# Patient Record
Sex: Female | Born: 1955 | Race: White | Hispanic: No | Marital: Married | Smoking: Never smoker
Health system: Southern US, Community
[De-identification: ages and names within clinical notes are randomized; demographics above are authoritative.]

## PROBLEM LIST (undated history)

## (undated) DIAGNOSIS — G4730 Sleep apnea, unspecified: Secondary | ICD-10-CM

## (undated) DIAGNOSIS — C4490 Unspecified malignant neoplasm of skin, unspecified: Secondary | ICD-10-CM

## (undated) DIAGNOSIS — C801 Malignant (primary) neoplasm, unspecified: Secondary | ICD-10-CM

## (undated) DIAGNOSIS — K219 Gastro-esophageal reflux disease without esophagitis: Secondary | ICD-10-CM

## (undated) DIAGNOSIS — I10 Essential (primary) hypertension: Secondary | ICD-10-CM

## (undated) DIAGNOSIS — E785 Hyperlipidemia, unspecified: Secondary | ICD-10-CM

## (undated) HISTORY — DX: Unspecified malignant neoplasm of skin, unspecified: C44.90

## (undated) HISTORY — PX: CHOLECYSTECTOMY: SHX55

---

## 2004-08-27 ENCOUNTER — Ambulatory Visit: Payer: Self-pay | Admitting: Obstetrics and Gynecology

## 2004-09-02 ENCOUNTER — Ambulatory Visit: Payer: Self-pay | Admitting: Obstetrics and Gynecology

## 2005-01-01 ENCOUNTER — Ambulatory Visit: Payer: Self-pay | Admitting: Obstetrics and Gynecology

## 2005-08-03 ENCOUNTER — Ambulatory Visit: Payer: Self-pay | Admitting: Family Medicine

## 2005-09-10 ENCOUNTER — Ambulatory Visit: Payer: Self-pay | Admitting: Family Medicine

## 2005-12-24 ENCOUNTER — Ambulatory Visit: Payer: Self-pay

## 2007-02-09 ENCOUNTER — Ambulatory Visit: Payer: Self-pay | Admitting: Family Medicine

## 2007-03-29 ENCOUNTER — Ambulatory Visit: Payer: Self-pay

## 2007-04-28 ENCOUNTER — Ambulatory Visit: Payer: Self-pay

## 2007-05-08 ENCOUNTER — Ambulatory Visit: Payer: Self-pay | Admitting: Family Medicine

## 2007-08-06 ENCOUNTER — Other Ambulatory Visit: Payer: Self-pay

## 2007-08-06 ENCOUNTER — Emergency Department: Payer: Self-pay | Admitting: Emergency Medicine

## 2008-04-03 ENCOUNTER — Ambulatory Visit: Payer: Self-pay

## 2008-06-17 ENCOUNTER — Ambulatory Visit: Payer: Self-pay | Admitting: Family Medicine

## 2008-09-28 DIAGNOSIS — L570 Actinic keratosis: Secondary | ICD-10-CM

## 2008-09-28 HISTORY — DX: Actinic keratosis: L57.0

## 2009-04-23 ENCOUNTER — Ambulatory Visit: Payer: Self-pay

## 2010-06-10 ENCOUNTER — Ambulatory Visit: Payer: Self-pay

## 2010-06-22 ENCOUNTER — Ambulatory Visit: Payer: Self-pay | Admitting: Family Medicine

## 2010-09-08 ENCOUNTER — Ambulatory Visit: Payer: Self-pay | Admitting: Gastroenterology

## 2011-07-21 ENCOUNTER — Ambulatory Visit: Payer: Self-pay

## 2012-12-20 ENCOUNTER — Ambulatory Visit: Payer: Self-pay | Admitting: Nurse Practitioner

## 2013-07-04 ENCOUNTER — Ambulatory Visit: Payer: Self-pay | Admitting: Physician Assistant

## 2013-07-04 LAB — CBC WITH DIFFERENTIAL/PLATELET
Basophil #: 0 10*3/uL (ref 0.0–0.1)
Basophil %: 0.7 %
EOS ABS: 0.1 10*3/uL (ref 0.0–0.7)
EOS PCT: 1.1 %
HCT: 43.5 % (ref 35.0–47.0)
HGB: 14.9 g/dL (ref 12.0–16.0)
LYMPHS PCT: 11.9 %
Lymphocyte #: 0.7 10*3/uL — ABNORMAL LOW (ref 1.0–3.6)
MCH: 31.9 pg (ref 26.0–34.0)
MCHC: 34.2 g/dL (ref 32.0–36.0)
MCV: 93 fL (ref 80–100)
Monocyte #: 0.6 x10 3/mm (ref 0.2–0.9)
Monocyte %: 10.1 %
Neutrophil #: 4.7 10*3/uL (ref 1.4–6.5)
Neutrophil %: 76.2 %
PLATELETS: 174 10*3/uL (ref 150–440)
RBC: 4.66 10*6/uL (ref 3.80–5.20)
RDW: 13.4 % (ref 11.5–14.5)
WBC: 6.2 10*3/uL (ref 3.6–11.0)

## 2013-07-04 LAB — BASIC METABOLIC PANEL
Anion Gap: 8 (ref 7–16)
BUN: 15 mg/dL (ref 7–18)
CALCIUM: 9.4 mg/dL (ref 8.5–10.1)
CO2: 28 mmol/L (ref 21–32)
CREATININE: 0.89 mg/dL (ref 0.60–1.30)
Chloride: 103 mmol/L (ref 98–107)
GLUCOSE: 80 mg/dL (ref 65–99)
Osmolality: 277 (ref 275–301)
POTASSIUM: 3.9 mmol/L (ref 3.5–5.1)
SODIUM: 139 mmol/L (ref 136–145)

## 2013-10-19 ENCOUNTER — Ambulatory Visit: Payer: Self-pay | Admitting: Unknown Physician Specialty

## 2014-03-14 ENCOUNTER — Ambulatory Visit: Payer: Self-pay | Admitting: Nurse Practitioner

## 2018-05-16 ENCOUNTER — Other Ambulatory Visit: Payer: Self-pay

## 2018-05-16 ENCOUNTER — Ambulatory Visit
Admission: EM | Admit: 2018-05-16 | Discharge: 2018-05-16 | Disposition: A | Discharge status: Home / Self Care | Payer: Managed Care, Other (non HMO) | Attending: Family Medicine | Admitting: Family Medicine

## 2018-05-16 DIAGNOSIS — Z79890 Hormone replacement therapy (postmenopausal): Secondary | ICD-10-CM | POA: Insufficient documentation

## 2018-05-16 DIAGNOSIS — R059 Cough, unspecified: Secondary | ICD-10-CM

## 2018-05-16 DIAGNOSIS — Z885 Allergy status to narcotic agent status: Secondary | ICD-10-CM | POA: Insufficient documentation

## 2018-05-16 DIAGNOSIS — J209 Acute bronchitis, unspecified: Secondary | ICD-10-CM | POA: Diagnosis not present

## 2018-05-16 DIAGNOSIS — K219 Gastro-esophageal reflux disease without esophagitis: Secondary | ICD-10-CM | POA: Diagnosis not present

## 2018-05-16 DIAGNOSIS — I10 Essential (primary) hypertension: Secondary | ICD-10-CM | POA: Diagnosis not present

## 2018-05-16 DIAGNOSIS — Z9049 Acquired absence of other specified parts of digestive tract: Secondary | ICD-10-CM | POA: Diagnosis not present

## 2018-05-16 DIAGNOSIS — J441 Chronic obstructive pulmonary disease with (acute) exacerbation: Secondary | ICD-10-CM | POA: Diagnosis not present

## 2018-05-16 DIAGNOSIS — R05 Cough: Secondary | ICD-10-CM | POA: Insufficient documentation

## 2018-05-16 DIAGNOSIS — E785 Hyperlipidemia, unspecified: Secondary | ICD-10-CM | POA: Insufficient documentation

## 2018-05-16 DIAGNOSIS — Z79899 Other long term (current) drug therapy: Secondary | ICD-10-CM | POA: Insufficient documentation

## 2018-05-16 DIAGNOSIS — J440 Chronic obstructive pulmonary disease with acute lower respiratory infection: Secondary | ICD-10-CM | POA: Diagnosis not present

## 2018-05-16 DIAGNOSIS — Z881 Allergy status to other antibiotic agents status: Secondary | ICD-10-CM | POA: Diagnosis not present

## 2018-05-16 HISTORY — DX: Gastro-esophageal reflux disease without esophagitis: K21.9

## 2018-05-16 HISTORY — DX: Essential (primary) hypertension: I10

## 2018-05-16 HISTORY — DX: Hyperlipidemia, unspecified: E78.5

## 2018-05-16 LAB — RAPID INFLUENZA A&B ANTIGENS
Influenza A (ARMC): NEGATIVE
Influenza B (ARMC): NEGATIVE

## 2018-05-16 MED ORDER — PREDNISONE 20 MG PO TABS: 40.0000 mg | ORAL_TABLET | Freq: Every day | ORAL | 0 refills | Status: AC

## 2018-05-16 MED ORDER — DOXYCYCLINE HYCLATE 100 MG PO CAPS: 100.0000 mg | ORAL_CAPSULE | Freq: Two times a day (BID) | ORAL | 0 refills | Status: AC

## 2018-05-16 NOTE — Discharge Instructions (Signed)
BRONCHITIS: Take Mucinex D throughout the day and drink plenty of fluids to break up the mucus . Take cough syrup at bedtime if needed, for cough and to assist with sleep. Take Ibuprofen or other NSAID for relief of any pleuritic pain. Increase rest and fluid intake. Return to the clinic, your PCP, or ER if you have any new/ worsening symptoms such as fever, chest pain, difficulty breathing, worsening cough, mental status changes, lethargy, etc.

## 2018-05-16 NOTE — ED Triage Notes (Signed)
Patient reports that she has been coughing x 3 weeks. Patient states that she did have some heaviness in her chest today with shortness of breath and headache, states that she had one episode of diarrhea.

## 2018-05-16 NOTE — ED Provider Notes (Signed)
[facility]    CSN: [id number] Arrival date & time: 05/16/18  1735     History   Chief Complaint Chief Complaint  Patient presents with  . Cough    HPI Gloria Simpson is a 62 y.o. female. Patient states she has been having cough, fatigue, headaches, and shortness of breath since earlier today. States she has a cold a couple weeks ago but seemed to get better until today. Denies fever. Patient states she has had diffuse chest pressure and "fullness" today. Denies pain. She does have COPD and has been using inhalers as prescribed. Patient has no other concerns today.  HPI  Past Medical History:  Diagnosis Date  . GERD (gastroesophageal reflux disease)   . Hyperlipidemia   . Hypertension     There are no active problems to display for this patient.   Past Surgical History:  Procedure Laterality Date  . CESAREAN SECTION    . CHOLECYSTECTOMY      OB History   No obstetric history on file.      Home Medications    Prior to Admission medications   Medication Sig Start Date End Date Taking? Authorizing Provider  albuterol (PROVENTIL HFA;VENTOLIN HFA) 108 (90 Base) MCG/ACT inhaler Inhale into the lungs. 04/21/18  Yes [provider]  atorvastatin (LIPITOR) 10 MG tablet TAKE 1 TABLET EVERY OTHER DAY 03/28/18  Yes [provider]  fluticasone (FLONASE) 50 MCG/ACT nasal spray Place into the nose. 03/25/16  Yes [provider]  levothyroxine (SYNTHROID, LEVOTHROID) 50 MCG tablet TAKE 1 TABLET NIGHTLY 03/28/18  Yes [provider]  pantoprazole (PROTONIX) 40 MG tablet TAKE 1 TABLET DAILY 05/03/18  Yes [provider]  valsartan-hydrochlorothiazide (DIOVAN-HCT) 80-12.5 MG tablet TAKE 1 TABLET DAILY 03/28/18  Yes [provider]  doxycycline (VIBRAMYCIN) 100 MG capsule Take 1 capsule (100 mg total) by mouth 2 (two) times daily for 7 days. 05/16/18 05/23/18  Danton Clap, PA-C  predniSONE (DELTASONE) 20 MG  tablet Take 2 tablets (40 mg total) by mouth daily for 5 days. 05/16/18 05/21/18  Danton Clap, PA-C    Family History Family History  Problem Relation Age of Onset  . Dementia Mother   . Kidney cancer Father     Social History Social History   Tobacco Use  . Smoking status: Never Smoker  . Smokeless tobacco: Never Used  Substance Use Topics  . Alcohol use: Never    Frequency: Never  . Drug use: Never     Allergies   Oxycodone-acetaminophen; Clarithromycin; and Codeine   Review of Systems Review of Systems  Constitutional: Positive for chills and fatigue. Negative for diaphoresis and fever.  HENT: Positive for congestion, ear pain, rhinorrhea and sore throat.   Eyes: Negative for discharge and redness.  Respiratory: Positive for cough and shortness of breath. Negative for chest tightness and wheezing.   Cardiovascular: Negative for chest pain and palpitations.  Gastrointestinal: Negative for abdominal pain and nausea.  Musculoskeletal: Negative for arthralgias, back pain and myalgias.  Skin: Negative for color change and rash.  Allergic/Immunologic: Negative for environmental allergies.  Neurological: Positive for light-headedness and headaches. Negative for dizziness and weakness.  Hematological: Positive for adenopathy.     Physical Exam Triage Vital Signs ED Triage Vitals  Enc Vitals Group     BP 05/16/18 1834 (!) 113/58     Pulse Rate 05/16/18 1834 93     Resp 05/16/18 1834 18     Temp 05/16/18 1834 99.1 F (  37.3 C)     Temp Source 05/16/18 1834 Oral     SpO2 05/16/18 1834 96 %     Weight 05/16/18 1829 167 lb (75.8 kg)     Height 05/16/18 1829 4' 10.5" (1.486 m)     Head Circumference --      Peak Flow --      Pain Score 05/16/18 1829 6     Pain Loc --      Pain Edu? --      Excl. in [place]? --    No data found.  Updated Vital Signs BP (!) 113/58 (BP Location: Left Arm)   Pulse 93   Temp 99.1 F (37.3 C) (Oral)   Resp 18   Ht 4' 10.5" (1.486 m)    Wt 167 lb (75.8 kg)   SpO2 96%   BMI 34.31 kg/m      Physical Exam Vitals signs and nursing note reviewed.  Constitutional:      General: She is not in acute distress.    Appearance: Normal appearance. She is obese. She is not ill-appearing, toxic-appearing or diaphoretic.  HENT:     Head: Normocephalic and atraumatic.     Right Ear: Tympanic membrane, ear canal and external ear normal.     Left Ear: Tympanic membrane, ear canal and external ear normal.     Nose: Congestion and rhinorrhea (mild clear rhinorrhea) present.     Mouth/Throat:     Mouth: Mucous membranes are moist.     Pharynx: Oropharynx is clear. No posterior oropharyngeal erythema.  Eyes:     General: No scleral icterus.       Right eye: No discharge.        Left eye: No discharge.     Conjunctiva/sclera: Conjunctivae normal.  Neck:     Musculoskeletal: No muscular tenderness.  Cardiovascular:     Rate and Rhythm: Normal rate and regular rhythm.     Pulses: Normal pulses.     Heart sounds: No murmur.  Pulmonary:     Effort: Pulmonary effort is normal. No respiratory distress.     Breath sounds: Normal breath sounds. No wheezing, rhonchi or rales.  Abdominal:     General: Bowel sounds are normal.     Palpations: Abdomen is soft.     Tenderness: There is no abdominal tenderness. There is no rebound.  Lymphadenopathy:     Cervical: No cervical adenopathy.  Skin:    General: Skin is warm and dry.     Findings: No erythema or rash.  Neurological:     Mental Status: She is alert and oriented to person, place, and time.     Motor: No weakness.     Gait: Gait normal.  Psychiatric:        Mood and Affect: Mood normal.        Behavior: Behavior normal.        Thought Content: Thought content normal.      UC Treatments / Results  Labs (all labs ordered are listed, but only abnormal results are displayed) Labs Reviewed  RAPID INFLUENZA A&B ANTIGENS ([facility] ONLY)    EKG Normal sinus rhythm and rate    Radiology No results found.  Procedures Procedures (including critical care time)  Medications Ordered in UC Medications - No data to display  Initial Impression / Assessment and Plan / UC Course  I have reviewed the triage vital signs and the nursing notes.  Pertinent labs & imaging results that were available during  my care of the patient were reviewed by me and considered in my medical decision making (see chart for details).    Final Clinical Impressions(s) / UC Diagnoses   Final diagnoses:  Acute bronchitis, unspecified organism  Cough  COPD exacerbation ([place])     Discharge Instructions     BRONCHITIS: Take Mucinex D throughout the day and drink plenty of fluids to break up the mucus . Take cough syrup at bedtime if needed, for cough and to assist with sleep. Take Ibuprofen or other NSAID for relief of any pleuritic pain. Increase rest and fluid intake. Return to the clinic, your PCP, or ER if you have any new/ worsening symptoms such as fever, chest pain, difficulty breathing, worsening cough, mental status changes, lethargy, etc.     ED Prescriptions    Medication Sig Dispense Auth. Provider   doxycycline (VIBRAMYCIN) 100 MG capsule Take 1 capsule (100 mg total) by mouth 2 (two) times daily for 7 days. 14 capsule Laurene Footman B, PA-C   predniSONE (DELTASONE) 20 MG tablet Take 2 tablets (40 mg total) by mouth daily for 5 days. 10 tablet Danton Clap, PA-C     Controlled Substance Prescriptions [place] Controlled Substance Registry consulted? Not Applicable   Gretta Cool 05/19/18 2014

## 2019-04-28 ENCOUNTER — Other Ambulatory Visit: Payer: Self-pay | Admitting: Nurse Practitioner

## 2019-04-28 DIAGNOSIS — Z1231 Encounter for screening mammogram for malignant neoplasm of breast: Secondary | ICD-10-CM

## 2019-06-08 ENCOUNTER — Ambulatory Visit
Admission: RE | Admit: 2019-06-08 | Discharge: 2019-06-08 | Disposition: A | Discharge status: Home / Self Care | Payer: Managed Care, Other (non HMO) | Source: Ambulatory Visit | Attending: Nurse Practitioner | Admitting: Nurse Practitioner

## 2019-06-08 ENCOUNTER — Other Ambulatory Visit: Payer: Self-pay

## 2019-06-08 DIAGNOSIS — Z1231 Encounter for screening mammogram for malignant neoplasm of breast: Secondary | ICD-10-CM | POA: Insufficient documentation

## 2019-06-08 HISTORY — DX: Malignant (primary) neoplasm, unspecified: C80.1

## 2020-04-17 ENCOUNTER — Other Ambulatory Visit: Payer: Self-pay

## 2020-04-17 ENCOUNTER — Ambulatory Visit (INDEPENDENT_AMBULATORY_CARE_PROVIDER_SITE_OTHER): Payer: Medicare Other | Admitting: Dermatology

## 2020-04-17 DIAGNOSIS — Z1283 Encounter for screening for malignant neoplasm of skin: Secondary | ICD-10-CM

## 2020-04-17 DIAGNOSIS — Z85828 Personal history of other malignant neoplasm of skin: Secondary | ICD-10-CM

## 2020-04-17 DIAGNOSIS — L821 Other seborrheic keratosis: Secondary | ICD-10-CM | POA: Diagnosis not present

## 2020-04-17 DIAGNOSIS — D485 Neoplasm of uncertain behavior of skin: Secondary | ICD-10-CM

## 2020-04-17 DIAGNOSIS — D2239 Melanocytic nevi of other parts of face: Secondary | ICD-10-CM

## 2020-04-17 DIAGNOSIS — L578 Other skin changes due to chronic exposure to nonionizing radiation: Secondary | ICD-10-CM

## 2020-04-17 DIAGNOSIS — L570 Actinic keratosis: Secondary | ICD-10-CM

## 2020-04-17 DIAGNOSIS — D229 Melanocytic nevi, unspecified: Secondary | ICD-10-CM

## 2020-04-17 DIAGNOSIS — D1800 Hemangioma unspecified site: Secondary | ICD-10-CM

## 2020-04-17 DIAGNOSIS — L814 Other melanin hyperpigmentation: Secondary | ICD-10-CM | POA: Diagnosis not present

## 2020-04-17 DIAGNOSIS — D0439 Carcinoma in situ of skin of other parts of face: Secondary | ICD-10-CM | POA: Diagnosis not present

## 2020-04-17 NOTE — Progress Notes (Signed)
New Patient Visit  Subjective  Gloria Simpson is a 64 y.o. female who presents for the following: Annual Exam (TBSE today. 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 Patient unsure which kind of skin cancer. No history of dysplastic nevi. ) and Spot check (Patient has places on face and feet she would like checked. ).    Objective  Well appearing patient in no apparent distress; mood and affect are within normal limits.  A full examination was performed including scalp, head, eyes, ears, nose, lips, neck, chest, axillae, abdomen, back, buttocks, bilateral upper extremities, bilateral lower extremities, hands, feet, fingers, toes, fingernails, and toenails. All findings within normal limits unless otherwise noted below.  Objective  Right nasal sidewall: 1.2 cm thin scaly pink plaque R/o BCC     Objective  left lateral brown x 1, left temple x 1 (2): Erythematous thin papules/macules with gritty scale.   Objective  Nose: Skin colored papule right medial brow  Assessment & Plan  Neoplasm of uncertain behavior of skin Right nasal sidewall  Skin / nail biopsy Type of biopsy: tangential   Informed consent: discussed and consent obtained   Timeout: patient name, date of birth, surgical site, and procedure verified   Procedure prep:  Patient was prepped and draped in usual sterile fashion Prep type:  Isopropyl alcohol Anesthesia: the lesion was anesthetized in a standard fashion   Anesthetic:  1% lidocaine w/ epinephrine 1-100,000 buffered w/ 8.4% NaHCO3 Instrument used: flexible razor blade   Hemostasis achieved with: pressure, aluminum chloride and electrodesiccation   Outcome: patient tolerated procedure well   Post-procedure details: sterile dressing applied and wound care instructions given   Dressing type: bandage and petrolatum    Specimen 1 - Surgical pathology Differential Diagnosis: R/o BCC Check Margins: No 1.2 cm thin scaly pink  plaque   Discussed possible Mohs surgery depending path report.     AK (actinic keratosis) (2) left lateral brown x 1, left temple x 1  Prior to procedure, discussed risks of blister formation, small wound, skin dyspigmentation, or rare scar following cryotherapy.    Destruction of lesion - left lateral brown x 1, left temple x 1  Destruction method: cryotherapy   Informed consent: discussed and consent obtained   Lesion destroyed using liquid nitrogen: Yes   Outcome: patient tolerated procedure well with no complications   Post-procedure details: wound care instructions given    Nevus Nose  Benign-appearing.  Observation.  Call clinic for new or changing moles.  Recommend daily use of broad spectrum spf 30+ sunscreen to sun-exposed areas.   Discussed removal if symptomatic but would leave a scar. Patient deterred today.    Lentigines - Scattered tan macules - Discussed due to sun exposure - Benign, observe - Call for any changes  Seborrheic Keratoses - Stuck-on, waxy, tan-brown papules and plaques  - Discussed benign etiology and prognosis. - Observe - Call for any changes  Melanocytic Nevi - Tan-brown and/or pink-flesh-colored symmetric macules and papules - Benign appearing on exam today - Observation - Call clinic for new or changing moles - Recommend daily use of broad spectrum spf 30+ sunscreen to sun-exposed areas.   Hemangiomas - Red papules - Discussed benign nature - Observe - Call for any changes  Actinic Damage - Chronic, secondary to cumulative UV/sun exposure - diffuse scaly erythematous macules with underlying dyspigmentation - Recommend daily broad spectrum sunscreen SPF 30+ to sun-exposed areas, reapply every 2 hours as needed.  - Call  for new or changing lesions.  Skin cancer screening performed today.  History of Skin Cancer  Right jaw and left ear.   Clear. Observe for recurrence.  Call clinic for new or changing lesions.    Recommend regular skin exams, daily broad-spectrum spf 30+ sunscreen use, and photoprotection.      Return in about 3 months (around 07/16/2020) for recheck AKs, TBSE in 6 months.   I, Harriett Sine, CMA, am acting as scribe for Forest Gleason, MD.  Documentation: I have reviewed the above documentation for accuracy and completeness, and I agree with the above.  Forest Gleason, MD

## 2020-04-17 NOTE — Patient Instructions (Addendum)
Wound Care Instructions  1. Cleanse wound gently with soap and water once a day then pat dry with clean gauze. Apply a thing coat of Petrolatum (petroleum jelly, "Vaseline") over the wound (unless you have an allergy to this). We recommend that you use a new, sterile tube of Vaseline. Do not pick or remove scabs. Do not remove the yellow or white "healing tissue" from the base of the wound.  2. Cover the wound with fresh, clean, nonstick gauze and secure with paper tape. You may use Band-Aids in place of gauze and tape if the would is small enough, but would recommend trimming much of the tape off as there is often too much. Sometimes Band-Aids can irritate the skin.  3. You should call the office for your biopsy report after 1 week if you have not already been contacted.  4. If you experience any problems, such as abnormal amounts of bleeding, swelling, significant bruising, significant pain, or evidence of infection, please call the office immediately.  5. FOR ADULT SURGERY PATIENTS: If you need something for pain relief you may take 1 extra strength Tylenol (acetaminophen) AND 2 Ibuprofen (200mg  each) together every 4 hours as needed for pain. (do not take these if you are allergic to them or if you have a reason you should not take them.) Typically, you may only need pain medication for 1 to 3 days.      Recommend Nicotinamide 500mg  twice per day to lower risk of non-melanoma skin cancer by approximately 25%.   Recommend daily broad spectrum sunscreen SPF 30+ to sun-exposed areas, reapply every 2 hours as needed. Call for new or changing lesions.  Recommend taking Heliocare sun protection supplement daily in sunny weather for additional sun protection. For maximum protection on the sunniest days, you can take up to 2 capsules of regular Heliocare OR take 1 capsule of Heliocare Ultra. For prolonged exposure (such as a full day in the sun), you can repeat your dose of the supplement 4 hours  after your first dose. Heliocare can be purchased at [facility] or at [email or web address].      Melanoma ABCDEs  Melanoma is the most dangerous type of skin cancer, and is the leading cause of death from skin disease.  You are more likely to develop melanoma if you:  Have light-colored skin, light-colored eyes, or red or blond hair  Spend a lot of time in the sun  Tan regularly, either outdoors or in a tanning bed  Have had blistering sunburns, especially during childhood  Have a close family member who has had a melanoma  Have atypical moles or large birthmarks  Early detection of melanoma is key since treatment is typically straightforward and cure rates are extremely high if we catch it early.   The first sign of melanoma is often a change in a mole or a new dark spot.  The ABCDE system is a way of remembering the signs of melanoma.  A for asymmetry:  The two halves do not match. B for border:  The edges of the growth are irregular. C for color:  A mixture of colors are present instead of an even brown color. D for diameter:  Melanomas are usually (but not always) greater than 20mm - the size of a pencil eraser. E for evolution:  The spot keeps changing in size, shape, and color.  Please check your skin once per month between visits. You can use a small mirror in front and a large  mirror behind you to keep an eye on the back side or your body.   If you see any new or changing lesions before your next follow-up, please call to schedule a visit.  Please continue daily skin protection including broad spectrum sunscreen SPF 30+ to sun-exposed areas, reapplying every 2 hours as needed when you're outdoors.

## 2020-04-19 ENCOUNTER — Other Ambulatory Visit: Payer: Self-pay | Admitting: Nurse Practitioner

## 2020-04-19 DIAGNOSIS — Z1231 Encounter for screening mammogram for malignant neoplasm of breast: Secondary | ICD-10-CM

## 2020-04-23 ENCOUNTER — Telehealth: Payer: Self-pay

## 2020-04-23 DIAGNOSIS — D099 Carcinoma in situ, unspecified: Secondary | ICD-10-CM

## 2020-04-23 NOTE — Telephone Encounter (Signed)
Pt informed of results. She wants to go back to Dr. Lacinda Axon for [facility]. Referral can be placed as soon as visit is signed. Thank you!

## 2020-04-23 NOTE — Telephone Encounter (Signed)
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 Jerilynn Mages left voicemail to call for results 04/23/2020 2 pm  [facility]As call starting 04/24/2020. Thank you!

## 2020-04-23 NOTE — Progress Notes (Signed)
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 Jerilynn Mages left voicemail to call for results 04/23/2020 2 pm  [facility]As call starting 04/24/2020. Thank you!

## 2020-04-24 ENCOUNTER — Encounter: Payer: Self-pay | Admitting: Dermatology

## 2020-04-24 NOTE — Telephone Encounter (Signed)
Note signed, thank you

## 2020-04-25 NOTE — Telephone Encounter (Signed)
Referral ordered

## 2020-04-25 NOTE — Addendum Note (Signed)
Addended by: Harriett Sine on: 04/25/2020 05:52 PM   Modules accepted: Orders

## 2020-06-07 DIAGNOSIS — C4492 Squamous cell carcinoma of skin, unspecified: Secondary | ICD-10-CM

## 2020-06-07 HISTORY — DX: Squamous cell carcinoma of skin, unspecified: C44.92

## 2020-06-10 ENCOUNTER — Ambulatory Visit: Payer: Medicare Other

## 2020-06-20 ENCOUNTER — Other Ambulatory Visit: Payer: Self-pay

## 2020-06-20 ENCOUNTER — Ambulatory Visit
Admission: RE | Admit: 2020-06-20 | Discharge: 2020-06-20 | Disposition: A | Discharge status: Home / Self Care | Payer: Medicare HMO | Source: Ambulatory Visit | Attending: Nurse Practitioner | Admitting: Nurse Practitioner

## 2020-06-20 DIAGNOSIS — Z1231 Encounter for screening mammogram for malignant neoplasm of breast: Secondary | ICD-10-CM | POA: Insufficient documentation

## 2020-07-15 ENCOUNTER — Other Ambulatory Visit (HOSPITAL_COMMUNITY): Payer: Self-pay

## 2020-07-15 ENCOUNTER — Other Ambulatory Visit: Payer: Self-pay

## 2020-07-15 DIAGNOSIS — R1031 Right lower quadrant pain: Secondary | ICD-10-CM

## 2020-07-18 ENCOUNTER — Ambulatory Visit: Payer: Medicare HMO | Admitting: Dermatology

## 2020-07-18 ENCOUNTER — Other Ambulatory Visit: Payer: Self-pay

## 2020-07-18 DIAGNOSIS — L820 Inflamed seborrheic keratosis: Secondary | ICD-10-CM | POA: Diagnosis not present

## 2020-07-18 DIAGNOSIS — Z872 Personal history of diseases of the skin and subcutaneous tissue: Secondary | ICD-10-CM | POA: Diagnosis not present

## 2020-07-18 DIAGNOSIS — Z85828 Personal history of other malignant neoplasm of skin: Secondary | ICD-10-CM

## 2020-07-18 NOTE — Progress Notes (Signed)
   Follow-Up Visit   Subjective  Gloria Simpson is a 65 y.o. female who presents for the following: Follow-up (Patient here today for 3 month AK follow up at left temple and left lateral brow. Patient does have a spot that itches at right temple. ).  The following portions of the chart were reviewed this encounter and updated as appropriate:   Tobacco  Allergies  Meds  Problems  Med Hx  Surg Hx  Fam Hx      Review of Systems:  No other skin or systemic complaints except as noted in HPI or Assessment and Plan.  Objective  Well appearing patient in no apparent distress; mood and affect are within normal limits.  A focused examination was performed including face. Relevant physical exam findings are noted in the Assessment and Plan.  Objective  right temple: Erythematous keratotic or waxy stuck-on papule or plaque.    Assessment & Plan  Inflamed seborrheic keratosis right temple  Prior to procedure, discussed risks of blister formation, small wound, skin dyspigmentation, or rare scar following cryotherapy.    Destruction of lesion - right temple Complexity: simple   Destruction method: cryotherapy   Informed consent: discussed and consent obtained   Lesion destroyed using liquid nitrogen: Yes   Cryotherapy cycles:  2 Outcome: patient tolerated procedure well with no complications   Post-procedure details: wound care instructions given     History of PreCancerous Actinic Keratosis  - sites of PreCancerous Actinic Keratosis clear today. - these may recur and new lesions may form requiring treatment to prevent transformation into skin cancer - observe for new or changing spots and contact [facility] for appointment if occur - photoprotection with sun protective clothing; sunglasses and broad spectrum sunscreen with SPF of at least 30 + and frequent self skin exams recommended - yearly exams by a dermatologist recommended for persons with history of  PreCancerous Actinic Keratoses  History of Squamous Cell Carcinoma in Situ of the Skin - No evidence of recurrence today at right nasal sidewall - Recommend regular full body skin exams - Recommend daily broad spectrum sunscreen SPF 30+ to sun-exposed areas, reapply every 2 hours as needed.  - Call if any new or changing lesions are noted between office visits   Return for TBSE as scheduled.  Graciella Belton, RMA, am acting as scribe for Forest Gleason, MD .  Documentation: I have reviewed the above documentation for accuracy and completeness, and I agree with the above.  Forest Gleason, MD

## 2020-07-18 NOTE — Patient Instructions (Addendum)
Recommend Serica moisturizing scar formula cream every night or [employer] brand or Mederma silicone scar sheet every night for the first year after a scar appears to help with scar remodeling if desired. Scars remodel on their own for a full year.   Cryotherapy Aftercare  . Wash gently with soap and water everyday.   Marland Kitchen Apply Vaseline and Band-Aid daily until healed.  Prior to procedure, discussed risks of blister formation, small wound, skin dyspigmentation, or rare scar following cryotherapy.    Recommend Nicotinamide 500mg  twice per day to lower risk of non-melanoma skin cancer by approximately 25%.

## 2020-07-21 ENCOUNTER — Encounter: Payer: Self-pay | Admitting: Dermatology

## 2020-07-24 ENCOUNTER — Other Ambulatory Visit: Payer: Self-pay

## 2020-07-24 ENCOUNTER — Ambulatory Visit
Admission: RE | Admit: 2020-07-24 | Discharge: 2020-07-24 | Disposition: A | Discharge status: Home / Self Care | Payer: Medicare HMO | Source: Ambulatory Visit

## 2020-07-24 DIAGNOSIS — R1031 Right lower quadrant pain: Secondary | ICD-10-CM | POA: Diagnosis not present

## 2020-07-24 LAB — POCT I-STAT CREATININE: Creatinine, Ser: 0.9 mg/dL (ref 0.44–1.00)

## 2020-07-24 MED ORDER — IOHEXOL 300 MG/ML  SOLN
100.0000 mL | Freq: Once | INTRAMUSCULAR | Status: AC | PRN
  Administered 2020-07-24: 100 mL via INTRAVENOUS

## 2020-10-17 ENCOUNTER — Encounter: Payer: Medicare Other | Admitting: Dermatology

## 2020-12-15 ENCOUNTER — Other Ambulatory Visit: Payer: Self-pay

## 2020-12-15 ENCOUNTER — Ambulatory Visit
Admission: EM | Admit: 2020-12-15 | Discharge: 2020-12-15 | Disposition: A | Discharge status: Home / Self Care | Payer: Medicare HMO | Attending: Emergency Medicine | Admitting: Emergency Medicine

## 2020-12-15 DIAGNOSIS — M62830 Muscle spasm of back: Secondary | ICD-10-CM

## 2020-12-15 MED ORDER — IBUPROFEN 600 MG PO TABS: 600.0000 mg | ORAL_TABLET | Freq: Four times a day (QID) | ORAL | 0 refills | Status: DC | PRN

## 2020-12-15 MED ORDER — METHOCARBAMOL 500 MG PO TABS: 500.0000 mg | ORAL_TABLET | Freq: Two times a day (BID) | ORAL | 0 refills | Status: DC | PRN

## 2020-12-15 NOTE — ED Provider Notes (Signed)
[facility]    CSN: [id number] Arrival date & time: 12/15/20  1007      History   Chief Complaint Chief Complaint  Patient presents with   Back Pain    HPI Gloria Simpson is a 65 y.o. female.  Patient presents with pain in her right mid back x8 days.  No falls, trauma, injury.  The pain started after she sneezed; feels like a "muscle strain"; worse with sitting and lying down; worse with palpation of the area; improves with ibuprofen, heat, ice.  She denies numbness, weakness, paresthesias, saddle anesthesia, loss of bowel/bladder control, fever, chills, cough, shortness of breath, abdominal pain, dysuria, hematuria, pelvic pain, or other symptoms.  Her medical history includes hypertension.  The history is provided by the patient and medical records.   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   Past Surgical History:  Procedure Laterality Date   CESAREAN SECTION     CHOLECYSTECTOMY      OB History   No obstetric history on file.      Home Medications    Prior to Admission medications   Medication Sig Start Date End Date Taking? Authorizing Provider  albuterol (PROVENTIL HFA;VENTOLIN HFA) 108 (90 Base) MCG/ACT inhaler Inhale into the lungs. 04/21/18  Yes [provider]  atorvastatin (LIPITOR) 10 MG tablet TAKE 1 TABLET EVERY OTHER DAY 03/28/18  Yes [provider]  fluticasone (FLONASE) 50 MCG/ACT nasal spray Place into the nose. 03/25/16  Yes [provider]  ibuprofen (ADVIL) 600 MG tablet Take 1 tablet (600 mg total) by mouth every 6 (six) hours as needed. 12/15/20  Yes Sharion Balloon, NP  levothyroxine (SYNTHROID, LEVOTHROID) 50  MCG tablet TAKE 1 TABLET NIGHTLY 03/28/18  Yes [provider]  methocarbamol (ROBAXIN) 500 MG tablet Take 1 tablet (500 mg total) by mouth 2 (two) times daily as needed for muscle spasms. 12/15/20  Yes Sharion Balloon, NP  pantoprazole (PROTONIX) 40 MG tablet TAKE 1 TABLET DAILY 05/03/18  Yes [provider]  valsartan-hydrochlorothiazide (DIOVAN-HCT) 80-12.5 MG tablet TAKE 1 TABLET DAILY 03/28/18  Yes [provider]    Family History Family History  Problem Relation Age of Onset   Dementia Mother    Kidney cancer Father    Breast cancer Paternal Aunt        3 pat aunts   Breast cancer Cousin        2 mat cousins    Social History Social History   Tobacco Use   Smoking status: Never   Smokeless tobacco: Never  Vaping Use   Vaping Use: Never used  Substance Use Topics   Alcohol use: Never   Drug use: Never     Allergies   Oxycodone-acetaminophen, Clarithromycin, and Codeine   Review of Systems Review of Systems  Constitutional:  Negative for chills and fever.  Respiratory:  Negative for cough and shortness of breath.   Cardiovascular:  Negative for chest pain and palpitations.  Gastrointestinal:  Negative for abdominal pain and vomiting.  Genitourinary:  Negative for dysuria and hematuria.  Musculoskeletal:  Positive for back  pain. Negative for arthralgias.  Skin:  Negative for color change and rash.  Neurological:  Negative for weakness and numbness.  All other systems reviewed and are negative.   Physical Exam Triage Vital Signs ED Triage Vitals  Enc Vitals Group     BP --      Pulse --      Resp --      Temp --      Temp src --      SpO2 --      Weight 12/15/20 1020 183 lb (83 kg)     Height 12/15/20 1020 '4\' 11"'$  (1.499 m)     Head Circumference --      Peak Flow --      Pain Score 12/15/20 1019 8     Pain Loc --      Pain Edu? --      Excl. in [place]? --    No data found.  Updated Vital Signs BP 132/88 (BP Location: Left  Arm)   Pulse 73   Temp 98.2 F (36.8 C) (Oral)   Resp 18   Ht '4\' 11"'$  (1.499 m)   Wt 183 lb (83 kg)   SpO2 96%   BMI 36.96 kg/m   Visual Acuity Right Eye Distance:   Left Eye Distance:   Bilateral Distance:    Right Eye Near:   Left Eye Near:    Bilateral Near:     Physical Exam Vitals and nursing note reviewed.  Constitutional:      General: She is not in acute distress.    Appearance: She is well-developed.  HENT:     Head: Normocephalic and atraumatic.     Mouth/Throat:     Mouth: Mucous membranes are moist.  Eyes:     Conjunctiva/sclera: Conjunctivae normal.  Cardiovascular:     Rate and Rhythm: Normal rate and regular rhythm.     Heart sounds: Normal heart sounds.  Pulmonary:     Effort: Pulmonary effort is normal. No respiratory distress.     Breath sounds: Normal breath sounds.  Abdominal:     Palpations: Abdomen is soft.     Tenderness: There is no abdominal tenderness. There is no right CVA tenderness, left CVA tenderness, guarding or rebound.  Musculoskeletal:        General: Tenderness present. No swelling, deformity or signs of injury. Normal range of motion.     Cervical back: Neck supple.       Back:  Skin:    General: Skin is warm and dry.     Capillary Refill: Capillary refill takes less than 2 seconds.     Findings: No bruising, erythema, lesion or rash.  Neurological:     General: No focal deficit present.     Mental Status: She is alert and oriented to person, place, and time.     Sensory: No sensory deficit.     Motor: No weakness.     Gait: Gait normal.  Psychiatric:        Mood and Affect: Mood normal.        Behavior: Behavior normal.     UC Treatments / Results  Labs (all labs ordered are listed, but only abnormal results are displayed) Labs Reviewed - No data to display  EKG   Radiology No results found.  Procedures Procedures (including critical care time)  Medications Ordered in UC Medications - No data to  display  Initial Impression / Assessment and Plan / UC Course  I have reviewed the triage vital signs and the nursing notes.  Pertinent labs & imaging results that were available during my care of the patient were reviewed by me and considered in my medical decision making (see chart for details).   Muscle spasm of back.  No indication of infection or injury.  Treating with ibuprofen and methocarbamol.  Precautions for drowsiness with methocarbamol discussed.  Education provided on acute back pain.  Instructed patient to follow-up with her PCP or an orthopedist if her symptoms are not improving.  She agrees to plan of care.   Final Clinical Impressions(s) / UC Diagnoses   Final diagnoses:  Muscle spasm of back     Discharge Instructions      Take ibuprofen as needed for discomfort.  Take the muscle relaxer as needed for muscle spasm; Do not drive, operate machinery, or drink alcohol with this medication as it can cause drowsiness.   Follow up with your primary care provider or an orthopedist if your symptoms are not improving.         ED Prescriptions     Medication Sig Dispense Auth. Provider   ibuprofen (ADVIL) 600 MG tablet Take 1 tablet (600 mg total) by mouth every 6 (six) hours as needed. 30 tablet Sharion Balloon, NP   methocarbamol (ROBAXIN) 500 MG tablet Take 1 tablet (500 mg total) by mouth 2 (two) times daily as needed for muscle spasms. 10 tablet Sharion Balloon, NP      I have reviewed the PDMP during this encounter.   Sharion Balloon, NP 12/15/20 1109

## 2020-12-15 NOTE — Discharge Instructions (Addendum)
Take ibuprofen as needed for discomfort.  Take the muscle relaxer as needed for muscle spasm; Do not drive, operate machinery, or drink alcohol with this medication as it can cause drowsiness.   Follow up with your primary care provider or an orthopedist if your symptoms are not improving.     

## 2020-12-15 NOTE — ED Triage Notes (Signed)
Patient states that she has been having mid back pain since last Sunday. States that this all started after a sneeze.

## 2021-01-22 ENCOUNTER — Encounter: Payer: Medicare HMO | Admitting: Dermatology

## 2021-01-28 ENCOUNTER — Ambulatory Visit: Payer: Medicare HMO | Admitting: Dermatology

## 2021-01-28 ENCOUNTER — Other Ambulatory Visit: Payer: Self-pay

## 2021-01-28 DIAGNOSIS — Z86007 Personal history of in-situ neoplasm of skin: Secondary | ICD-10-CM | POA: Diagnosis not present

## 2021-01-28 DIAGNOSIS — D229 Melanocytic nevi, unspecified: Secondary | ICD-10-CM

## 2021-01-28 DIAGNOSIS — L814 Other melanin hyperpigmentation: Secondary | ICD-10-CM

## 2021-01-28 DIAGNOSIS — B029 Zoster without complications: Secondary | ICD-10-CM

## 2021-01-28 DIAGNOSIS — L578 Other skin changes due to chronic exposure to nonionizing radiation: Secondary | ICD-10-CM

## 2021-01-28 DIAGNOSIS — D2371 Other benign neoplasm of skin of right lower limb, including hip: Secondary | ICD-10-CM

## 2021-01-28 DIAGNOSIS — D239 Other benign neoplasm of skin, unspecified: Secondary | ICD-10-CM

## 2021-01-28 DIAGNOSIS — D1800 Hemangioma unspecified site: Secondary | ICD-10-CM

## 2021-01-28 DIAGNOSIS — Z1283 Encounter for screening for malignant neoplasm of skin: Secondary | ICD-10-CM | POA: Diagnosis not present

## 2021-01-28 DIAGNOSIS — L821 Other seborrheic keratosis: Secondary | ICD-10-CM

## 2021-01-28 NOTE — Patient Instructions (Addendum)
Recommend a silicone scar cream to scar on right nasal sidewall  - Serica.   Could consider a BBL laser treatment for the blood vessels.   Recommend OTC Gold Bond Rapid Relief Anti-Itch cream (pramoxine + menthol) up to 3 times per day to areas that are itchy.  Recommend daily broad spectrum sunscreen SPF 30+ to sun-exposed areas, reapply every 2 hours as needed. Call for new or changing lesions.  Staying in the shade or wearing long sleeves, sun glasses (UVA+UVB protection) and wide brim hats (4-inch brim around the entire circumference of the hat) are also recommended for sun protection.   Recommend taking Heliocare sun protection supplement daily in sunny weather for additional sun protection. For maximum protection on the sunniest days, you can take up to 2 capsules of regular Heliocare OR take 1 capsule of Heliocare Ultra. For prolonged exposure (such as a full day in the sun), you can repeat your dose of the supplement 4 hours after your first dose. Heliocare can be purchased at [facility] or at [email or web address].    If you have any questions or concerns for your doctor, please call our main line at [phone] and press option 4 to reach your doctor's medical assistant. If no one answers, please leave a voicemail as directed and we will return your call as soon as possible. Messages left after 4 pm will be answered the following business day.   You may also send [place] a message via [place]. We typically respond to MyChart messages within 1-2 business days.  For prescription refills, please ask your pharmacy to contact our office. Our fax number is [phone].  If you have an urgent issue when the clinic is closed that cannot wait until the next business day, you can page your doctor at the number below.    Please note that while we do our best to be available for urgent issues outside of office hours, we are not available 24/7.   If you have an urgent issue and are unable to  reach [place], you may choose to seek medical care at your doctor's office, retail clinic, urgent care center, or emergency room.  If you have a medical emergency, please immediately call 911 or go to the emergency department.  Pager Numbers  - Dr. Nehemiah Massed: [phone]  - Dr. Laurence Ferrari: [phone]  - Dr. Nicole Kindred: [phone]  In the event of inclement weather, please call our main line at [phone] for an update on the status of any delays or closures.  Dermatology Medication Tips: Please keep the boxes that topical medications come in in order to help keep track of the instructions about where and how to use these. Pharmacies typically print the medication instructions only on the boxes and not directly on the medication tubes.   If your medication is too expensive, please contact our office at [phone] option 4 or send [place] a message through [place].   We are unable to tell what your co-pay for medications will be in advance as this is different depending on your insurance coverage. However, we may be able to find a substitute medication at lower cost or fill out paperwork to get insurance to cover a needed medication.   If a prior authorization is required to get your medication covered by your insurance company, please allow [place] 1-2 business days to complete this process.  Drug prices often vary depending on where the prescription is filled and some pharmacies may offer cheaper prices.  The website www.goodrx.com contains  coupons for medications through different pharmacies. The prices here do not account for what the cost may be with help from insurance (it may be cheaper with your insurance), but the website can give you the price if you did not use any insurance.  - You can print the associated coupon and take it with your prescription to the pharmacy.  - You may also stop by our office during regular business hours and pick up a GoodRx coupon card.  - If you need your prescription  sent electronically to a different pharmacy, notify our office through [facility] or by phone at [phone] option 4.

## 2021-01-28 NOTE — Progress Notes (Signed)
   Follow-Up Visit   Subjective  Gloria Simpson is a 65 y.o. female who presents for the following: TBSE (Total body exam today. Pt has hx of SCCis at right nasal sidewall that was treated in the past year with [facility]. Pt states that she is currently recovering from shingles on her back, right flank, and abdomen. She reports nothing new or changing that she has noticed. ).  Patient here for full body skin exam and skin cancer screening.   The following portions of the chart were reviewed this encounter and updated as appropriate:  Tobacco  Allergies  Meds  Problems  Med Hx  Surg Hx  Fam Hx      Review of Systems: No other skin or systemic complaints except as noted in HPI or Assessment and Plan.   Objective  Well appearing patient in no apparent distress; mood and affect are within normal limits.  A full examination was performed including scalp, head, eyes, ears, nose, lips, neck, chest, axillae, abdomen, back, buttocks, bilateral upper extremities, bilateral lower extremities, hands, feet, fingers, toes, fingernails, and toenails. All findings within normal limits unless otherwise noted below.  Right Thigh - Anterior Firm pink/brown papulenodule with dimple sign.   Assessment & Plan  Dermatofibroma Right Thigh - Anterior  - Benign appearing - Call for any changes   Lentigines - Scattered tan macules - Due to sun exposure - Benign-appering, observe - Recommend daily broad spectrum sunscreen SPF 30+ to sun-exposed areas, reapply every 2 hours as needed. - Call for any changes  Seborrheic Keratoses - Stuck-on, waxy, tan-brown papules and/or plaques  - Benign-appearing - Discussed benign etiology and prognosis. - Observe - Call for any changes  Melanocytic Nevi - Tan-brown and/or pink-flesh-colored symmetric macules and papules - Benign appearing on exam today - Observation - Call clinic for new or changing moles - Recommend daily use of broad spectrum spf 30+  sunscreen to sun-exposed areas.   Hemangiomas - Red papules - Discussed benign nature - Observe - Call for any changes  Actinic Damage - Chronic condition, secondary to cumulative UV/sun exposure - diffuse scaly erythematous macules with underlying dyspigmentation - Recommend daily broad spectrum sunscreen SPF 30+ to sun-exposed areas, reapply every 2 hours as needed.  - Staying in the shade or wearing long sleeves, sun glasses (UVA+UVB protection) and wide brim hats (4-inch brim around the entire circumference of the hat) are also recommended for sun protection.  - Call for new or changing lesions.  Skin cancer screening performed today.  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  - Call if any new or changing lesions are noted between office visits -Recommend Serica silicone scar cream 1-2 times a day. +  Return in about 6 months (around 07/28/2021) for TBSE.  I, Harriett Sine, CMA, am acting as scribe for Forest Gleason, MD.  Documentation: I have reviewed the above documentation for accuracy and completeness, and I agree with the above.  Forest Gleason, MD

## 2021-02-05 ENCOUNTER — Encounter: Payer: Self-pay | Admitting: Dermatology

## 2021-04-01 ENCOUNTER — Ambulatory Visit: Payer: Medicare HMO | Admitting: Urology

## 2021-04-01 ENCOUNTER — Other Ambulatory Visit: Payer: Self-pay

## 2021-04-01 ENCOUNTER — Encounter: Payer: Self-pay | Admitting: Urology

## 2021-04-01 ENCOUNTER — Other Ambulatory Visit
Admission: RE | Admit: 2021-04-01 | Discharge: 2021-04-01 | Disposition: A | Discharge status: Home / Self Care | Payer: Medicare HMO | Attending: Urology | Admitting: Urology

## 2021-04-01 ENCOUNTER — Other Ambulatory Visit: Payer: Self-pay | Admitting: *Deleted

## 2021-04-01 VITALS — BP 157/82 | HR 78 | Ht 58.5 in | Wt 183.0 lb

## 2021-04-01 DIAGNOSIS — Z87440 Personal history of urinary (tract) infections: Secondary | ICD-10-CM | POA: Diagnosis present

## 2021-04-01 DIAGNOSIS — N390 Urinary tract infection, site not specified: Secondary | ICD-10-CM

## 2021-04-01 DIAGNOSIS — R310 Gross hematuria: Secondary | ICD-10-CM | POA: Diagnosis not present

## 2021-04-01 LAB — URINALYSIS, COMPLETE (UACMP) WITH MICROSCOPIC
Bilirubin Urine: NEGATIVE
Glucose, UA: NEGATIVE mg/dL
Hgb urine dipstick: NEGATIVE
Ketones, ur: NEGATIVE mg/dL
Nitrite: NEGATIVE
Protein, ur: NEGATIVE mg/dL
Specific Gravity, Urine: 1.02 (ref 1.005–1.030)
pH: 6 (ref 5.0–8.0)

## 2021-04-01 LAB — BLADDER SCAN AMB NON-IMAGING

## 2021-04-01 NOTE — Progress Notes (Signed)
   04/01/21 11:31 AM   Gloria Simpson [birth year] [id number]  CC: Gross hematuria  HPI: 65 year old female who had 2 episodes of asymptomatic gross hematuria about 1 month ago.  Urinalysis at that time showed few squamous cells, moderate bacteria, 10-50 WBCs, 10-50 RBCs, small leuk esterase, nitrite negative, but culture was negative.  She was treated with an antibiotic by her PCP.  She is also had some vague lower crampy abdominal pain and pressure over the last few weeks.  She has a 33-pack-year smoking history and quit 15 years ago.  She denies any dysuria or urgency/frequency.  PVR is normal at 20 mL.  I reviewed her CT abdomen and pelvis with contrast from March 2022 that shows no urologic abnormalities.  Urinalysis today with 6-10 squamous cells, 6-10 WBCs, 0-5 RBC, few bacteria, trace leukocytes.  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 She has never used smokeless tobacco. She reports that she does not drink alcohol and does not use drugs.  Physical Exam: BP (!) 157/82 (BP Location: Left Arm, Patient Position: Sitting, Cuff Size: Large)   Pulse 78   Ht 4' 10.5" (1.486 m)   Wt 183 lb (83 kg)   BMI 37.60 kg/m    Constitutional:  Alert and oriented, No acute distress. Cardiovascular: No clubbing, cyanosis, or edema. Respiratory: Normal respiratory  effort, no increased work of breathing. GI: Abdomen is soft, nontender, nondistended, no abdominal masses   Laboratory Data: Reviewed, see HPI  Pertinent Imaging: I have personally viewed and interpreted the CT abdomen and pelvis with contrast from March 2022 showing no urologic abnormalities.  Assessment & Plan:   65 year old female with 30-pack-year smoking history who presents with 2 episodes of painless gross hematuria.  CT abdomen and pelvis with contrast from March 2022 with no urologic abnormalities.  We discussed common possible etiologies of hematuria including BPH, malignancy, urolithiasis, medical renal disease, and idiopathic. Standard workup recommended by the [facility] includes imaging with CT urogram to assess the upper tracts, and cystoscopy. Cytology is performed on patient's with gross hematuria to look for malignant cells in the urine.  I recommended cystoscopy and cytology to complete hematuria work-up  Nickolas Madrid, MD 04/01/2021  [facility] [address], [place], [place] [address] [phone]

## 2021-04-01 NOTE — Patient Instructions (Signed)

## 2021-04-03 LAB — URINE CULTURE

## 2021-04-04 ENCOUNTER — Telehealth: Payer: Self-pay

## 2021-04-04 NOTE — Telephone Encounter (Signed)
Incoming call on triage line from [place] at [facility], she states that this pt's ureaplasma/mycoplasma specimen was lost and therefore cannot be completed. Please advise on how to proceed/recollection.

## 2021-04-16 ENCOUNTER — Other Ambulatory Visit: Payer: Medicare HMO | Admitting: Urology

## 2021-04-24 ENCOUNTER — Ambulatory Visit: Payer: Medicare HMO | Admitting: Urology

## 2021-04-24 ENCOUNTER — Other Ambulatory Visit: Payer: Self-pay

## 2021-04-24 ENCOUNTER — Encounter: Payer: Self-pay | Admitting: Urology

## 2021-04-24 VITALS — BP 95/63 | HR 87 | Ht 58.5 in | Wt 183.0 lb

## 2021-04-24 DIAGNOSIS — N390 Urinary tract infection, site not specified: Secondary | ICD-10-CM | POA: Diagnosis not present

## 2021-04-24 NOTE — Progress Notes (Signed)
Cystoscopy Procedure Note:  Indication: Gross hematuria  After informed consent and discussion of the procedure and its risks, Gloria Simpson was positioned and prepped in the standard fashion. Cystoscopy was performed with a flexible cystoscope. The urethra, bladder neck and entire bladder was visualized in a standard fashion.  The ureteral orifices were visualized in their normal location and orientation.  Bladder mucosa grossly normal throughout, no abnormalities on retroflexion.  Cytology sent.  Findings: Normal cystoscopy  Assessment and Plan: Will call with cytology, follow-up with urology as needed  Nickolas Madrid, MD 04/24/2021

## 2021-04-28 LAB — CYTOLOGY - NON PAP

## 2021-05-13 ENCOUNTER — Other Ambulatory Visit: Payer: Self-pay | Admitting: Nurse Practitioner

## 2021-05-13 DIAGNOSIS — Z1231 Encounter for screening mammogram for malignant neoplasm of breast: Secondary | ICD-10-CM

## 2021-06-23 ENCOUNTER — Ambulatory Visit
Admission: RE | Admit: 2021-06-23 | Discharge: 2021-06-23 | Disposition: A | Discharge status: Home / Self Care | Payer: Medicare HMO | Source: Ambulatory Visit | Attending: Nurse Practitioner | Admitting: Nurse Practitioner

## 2021-06-23 ENCOUNTER — Other Ambulatory Visit: Payer: Self-pay

## 2021-06-23 DIAGNOSIS — Z1231 Encounter for screening mammogram for malignant neoplasm of breast: Secondary | ICD-10-CM | POA: Diagnosis not present

## 2021-07-31 ENCOUNTER — Encounter: Payer: Medicare HMO | Admitting: Dermatology

## 2021-09-08 ENCOUNTER — Telehealth: Payer: Self-pay | Admitting: Acute Care

## 2021-09-08 NOTE — Telephone Encounter (Signed)
Spoke with patient by phone and confirmed patient has quit smoking over 15 years.  Quit in 2007.  She is not eligible for the [facility] LDCT but an alternative would be to get a Chest CT wo contrast, ordered by PCP as recommended. Will fax message to PCP  ?

## 2022-01-29 ENCOUNTER — Encounter: Payer: Self-pay | Admitting: Dermatology

## 2022-01-29 ENCOUNTER — Ambulatory Visit: Payer: Medicare HMO | Admitting: Dermatology

## 2022-01-29 DIAGNOSIS — Z1283 Encounter for screening for malignant neoplasm of skin: Secondary | ICD-10-CM

## 2022-01-29 DIAGNOSIS — L578 Other skin changes due to chronic exposure to nonionizing radiation: Secondary | ICD-10-CM

## 2022-01-29 DIAGNOSIS — L821 Other seborrheic keratosis: Secondary | ICD-10-CM

## 2022-01-29 DIAGNOSIS — D229 Melanocytic nevi, unspecified: Secondary | ICD-10-CM

## 2022-01-29 DIAGNOSIS — Z86007 Personal history of in-situ neoplasm of skin: Secondary | ICD-10-CM | POA: Diagnosis not present

## 2022-01-29 DIAGNOSIS — L570 Actinic keratosis: Secondary | ICD-10-CM | POA: Diagnosis not present

## 2022-01-29 DIAGNOSIS — L814 Other melanin hyperpigmentation: Secondary | ICD-10-CM

## 2022-01-29 DIAGNOSIS — D1800 Hemangioma unspecified site: Secondary | ICD-10-CM

## 2022-01-29 MED ORDER — FLUOROURACIL 5 % EX CREA: TOPICAL_CREAM | CUTANEOUS | 0 refills | Status: AC

## 2022-01-29 NOTE — Patient Instructions (Addendum)
- Start 5-fluorouracil cream twice a day for 7 days to affected areas including face.   5-fluorouracil cream is is a type of field treatment used to treat precancers, thin skin cancers, and areas of sun damage. Expected reaction includes irritation and mild inflammation potentially progressing to more severe inflammation including redness, scaling, crusting and open sores/erosions.  If too much irritation occurs, ensure application of only a thin layer and decrease frequency of use to achieve a tolerable level of inflammation. Recommend applying Vaseline ointment to open sores as needed.  Minimize sun exposure while under treatment. Recommend daily broad spectrum sunscreen SPF 30+ to sun-exposed areas, reapply every 2 hours as needed.     Recommend Niacinamide or Nicotinamide '500mg'$  twice per day to lower risk of non-melanoma skin cancer by approximately 25%. This is usually available at [employer]. Can interact with cholesterol medicine. Stop taking if having muscle aches/pains.   Recommend taking Heliocare sun protection supplement daily in sunny weather for additional sun protection. For maximum protection on the sunniest days, you can take up to 2 capsules of regular Heliocare OR take 1 capsule of Heliocare Ultra. For prolonged exposure (such as a full day in the sun), you can repeat your dose of the supplement 4 hours after your first dose. Heliocare can be purchased at [employer], at some [facility] or at [email or web address].    Recommend daily broad spectrum sunscreen SPF 30+ to sun-exposed areas, reapply every 2 hours as needed. Call for new or changing lesions.  Staying in the shade or wearing long sleeves, sun glasses (UVA+UVB protection) and wide brim hats (4-inch brim around the entire circumference of the hat) are also recommended for sun protection.    Melanoma ABCDEs  Melanoma is the most dangerous type of skin cancer, and is the leading cause of death from skin disease.  You  are more likely to develop melanoma if you: Have light-colored skin, light-colored eyes, or red or blond hair Spend a lot of time in the sun Tan regularly, either outdoors or in a tanning bed Have had blistering sunburns, especially during childhood Have a close family member who has had a melanoma Have atypical moles or large birthmarks  Early detection of melanoma is key since treatment is typically straightforward and cure rates are extremely high if we catch it early.   The first sign of melanoma is often a change in a mole or a new dark spot.  The ABCDE system is a way of remembering the signs of melanoma.  A for asymmetry:  The two halves do not match. B for border:  The edges of the growth are irregular. C for color:  A mixture of colors are present instead of an even brown color. D for diameter:  Melanomas are usually (but not always) greater than 10m - the size of a pencil eraser. E for evolution:  The spot keeps changing in size, shape, and color.  Please check your skin once per month between visits. You can use a small mirror in front and a large mirror behind you to keep an eye on the back side or your body.   If you see any new or changing lesions before your next follow-up, please call to schedule a visit.  Please continue daily skin protection including broad spectrum sunscreen SPF 30+ to sun-exposed areas, reapplying every 2 hours as needed when you're outdoors.   Staying in the shade or wearing long sleeves, sun glasses (UVA+UVB protection) and wide brim hats (  4-inch brim around the entire circumference of the hat) are also recommended for sun protection.     Due to recent changes in healthcare laws, you may see results of your pathology and/or laboratory studies on MyChart before the doctors have had a chance to review them. We understand that in some cases there may be results that are confusing or concerning to you. Please understand that not all results are received  at the same time and often the doctors may need to interpret multiple results in order to provide you with the best plan of care or course of treatment. Therefore, we ask that you please give [place] 2 business days to thoroughly review all your results before contacting the office for clarification. Should we see a critical lab result, you will be contacted sooner.   If You Need Anything After Your Visit  If you have any questions or concerns for your doctor, please call our main line at [phone] and press option 4 to reach your doctor's medical assistant. If no one answers, please leave a voicemail as directed and we will return your call as soon as possible. Messages left after 4 pm will be answered the following business day.   You may also send [place] a message via [place]. We typically respond to MyChart messages within 1-2 business days.  For prescription refills, please ask your pharmacy to contact our office. Our fax number is [phone].  If you have an urgent issue when the clinic is closed that cannot wait until the next business day, you can page your doctor at the number below.    Please note that while we do our best to be available for urgent issues outside of office hours, we are not available 24/7.   If you have an urgent issue and are unable to reach [place], you may choose to seek medical care at your doctor's office, retail clinic, urgent care center, or emergency room.  If you have a medical emergency, please immediately call 911 or go to the emergency department.  Pager Numbers  - Dr. Nehemiah Massed: [phone]  - Dr. Laurence Ferrari: [phone]  - Dr. Nicole Kindred: [phone]  In the event of inclement weather, please call our main line at [phone] for an update on the status of any delays or closures.  Dermatology Medication Tips: Please keep the boxes that topical medications come in in order to help keep track of the instructions about where and how to use these. Pharmacies  typically print the medication instructions only on the boxes and not directly on the medication tubes.   If your medication is too expensive, please contact our office at [phone] option 4 or send [place] a message through [place].   We are unable to tell what your co-pay for medications will be in advance as this is different depending on your insurance coverage. However, we may be able to find a substitute medication at lower cost or fill out paperwork to get insurance to cover a needed medication.   If a prior authorization is required to get your medication covered by your insurance company, please allow [place] 1-2 business days to complete this process.  Drug prices often vary depending on where the prescription is filled and some pharmacies may offer cheaper prices.  The website www.goodrx.com contains coupons for medications through different pharmacies. The prices here do not account for what the cost may be with help from insurance (it may be cheaper with your insurance), but the website can give you the  price if you did not use any insurance.  - You can print the associated coupon and take it with your prescription to the pharmacy.  - You may also stop by our office during regular business hours and pick up a GoodRx coupon card.  - If you need your prescription sent electronically to a different pharmacy, notify our office through [facility] or by phone at [phone] option 4.     Si Usted Necesita Algo Despus de Su Visita  Tambin puede enviarnos un mensaje a travs de Pharmacist, community. Por lo general respondemos a los mensajes de MyChart en el transcurso de 1 a 2 das hbiles.  Para renovar recetas, por favor pida a su farmacia que se ponga en contacto con nuestra oficina. Harland Dingwall de fax es [place] [phone].  Si tiene un asunto urgente cuando la clnica est cerrada y que no puede esperar hasta el siguiente da hbil, puede llamar/localizar a su doctor(a) al nmero que  aparece a continuacin.   Por favor, tenga en cuenta que aunque hacemos todo lo posible para estar disponibles para asuntos urgentes fuera del horario de [place], no estamos disponibles las 24 horas del da, los 7 das de la [place].   Si tiene un problema urgente y no puede comunicarse con nosotros, puede optar por buscar atencin mdica  en el consultorio de su doctor(a), en una clnica privada, en un centro de atencin urgente o en una sala de emergencias.  Si tiene Engineering geologist, por favor llame inmediatamente al 911 o vaya a la sala de emergencias.  Nmeros de bper  - Dr. Nehemiah Massed: [phone]  - Dra. Moye: [phone]  - Dra. Nicole Kindred: [phone]  En caso de inclemencias del [place], por favor llame a Johnsie Kindred principal al [phone] para una actualizacin sobre el [place] de cualquier retraso o cierre.  Consejos para la medicacin en dermatologa: Por favor, guarde las cajas en las que vienen los medicamentos de uso tpico para ayudarle a seguir las instrucciones sobre dnde y cmo usarlos. Las farmacias generalmente imprimen las instrucciones del medicamento slo en las cajas y no directamente en los tubos del [place].   Si su medicamento es muy caro, por favor, pngase en contacto con Zigmund Daniel llamando al [phone] y presione la opcin 4 o envenos un mensaje a travs de Pharmacist, community.   No podemos decirle cul ser su copago por los medicamentos por adelantado ya que esto es diferente dependiendo de la cobertura de su seguro. Sin embargo, es posible que podamos encontrar un medicamento sustituto a Electrical engineer un formulario para que el seguro cubra el medicamento que se considera necesario.   Si se requiere una autorizacin previa para que su compaa de seguros [place] su medicamento, por favor permtanos de 1 a 2 das hbiles para completar este proceso.  Los precios de los medicamentos varan con frecuencia dependiendo del Environmental consultant de dnde se surte  la receta y alguna farmacias pueden ofrecer precios ms baratos.  El sitio web www.goodrx.com tiene cupones para medicamentos de Airline pilot. Los precios aqu no tienen en cuenta lo que podra costar con la ayuda del seguro (puede ser ms barato con su seguro), pero el sitio web puede darle el precio si no utiliz Research scientist (physical sciences).  - Puede imprimir el cupn correspondiente y llevarlo con su receta a la farmacia.  - Tambin puede pasar por nuestra oficina durante el horario de atencin regular y Charity fundraiser una tarjeta de cupones de GoodRx.  - Si necesita que  su receta se enve electrnicamente a una farmacia diferente, informe a nuestra oficina a travs de MyChart de [place] o por telfono llamando al [phone] y presione la opcin 4.  

## 2022-01-29 NOTE — Progress Notes (Unsigned)
Follow-Up Visit   Subjective  Gloria Simpson is a 66 y.o. female who presents for the following: Annual Exam (HxAK's. HxSCCis, right nasal sidewall; scar is rubbed by c-pap machine).  The patient presents for Total-Body Skin Exam (TBSE) for skin cancer screening and mole check.  The patient has spots, moles and lesions to be evaluated, some may be new or changing and the patient has concerns that these could be cancer.   The following portions of the chart were reviewed this encounter and updated as appropriate:  Tobacco  Allergies  Meds  Problems  Med Hx  Surg Hx  Fam Hx      Review of Systems: No other skin or systemic complaints except as noted in HPI or Assessment and Plan.   Objective  Well appearing patient in no apparent distress; mood and affect are within normal limits.  A full examination was performed including scalp, head, eyes, ears, nose, lips, neck, chest, axillae, abdomen, back, buttocks, bilateral upper extremities, bilateral lower extremities, hands, feet, fingers, toes, fingernails, and toenails. All findings within normal limits unless otherwise noted below.    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  - No evidence of recurrence today - Recommend regular full body skin exams - Recommend daily broad spectrum sunscreen SPF 30+ to sun-exposed areas, reapply every 2 hours as needed.  - Call if any new or changing lesions are noted between office visits   Lentigines - Scattered tan macules - Due to sun exposure - Benign-appearing, observe - Recommend daily broad spectrum sunscreen SPF 30+ to sun-exposed areas, reapply every 2 hours as needed. - Call for any changes  Seborrheic Keratoses - Stuck-on, waxy, tan-brown papules and/or plaques  - Benign-appearing - Discussed benign etiology and prognosis. - Observe - Call for any changes  Melanocytic Nevi - Tan-brown and/or pink-flesh-colored  symmetric macules and papules - Benign appearing on exam today - Observation - Call clinic for new or changing moles - Recommend daily use of broad spectrum spf 30+ sunscreen to sun-exposed areas.   Hemangiomas - Red papules - Discussed benign nature - Observe - Call for any changes  Actinic Damage - Severe, confluent actinic changes with pre-cancerous actinic keratoses  - Severe, chronic, not at goal, secondary to cumulative UV radiation exposure over time - diffuse scaly erythematous macules and papules with underlying dyspigmentation - Discussed Prescription "Field Treatment" for Severe, Chronic Confluent Actinic Changes with Pre-Cancerous Actinic Keratoses Field treatment involves treatment of an entire area of skin that has confluent Actinic Changes (Sun/ Ultraviolet light damage) and PreCancerous Actinic Keratoses by method of PhotoDynamic Therapy (PDT) and/or prescription Topical Chemotherapy agents such as 5-fluorouracil, 5-fluorouracil/calcipotriene, and/or imiquimod.  The purpose is to decrease the number of clinically evident and subclinical PreCancerous lesions to prevent progression to development of skin cancer by chemically destroying early precancer changes that may or may not be visible.  It has been shown to reduce the risk of developing skin cancer in the treated area. As a result of treatment, redness, scaling, crusting, and open sores may occur during treatment course. One or more than one of these methods may be used and may have to be used several times to control, suppress and eliminate the PreCancerous changes. Discussed treatment course, expected reaction, and possible side effects. - Recommend daily broad spectrum sunscreen SPF 30+ to sun-exposed areas, reapply every 2 hours as needed.  - Staying in the shade or wearing  long sleeves, sun glasses (UVA+UVB protection) and wide brim hats (4-inch brim around the entire circumference of the hat) are also recommended. - Call  for new or changing lesions.  - Start 5-fluorouracil cream twice a day for 7 days to affected areas including face.   Skin cancer screening performed today.   Return in about 6 months (around 07/30/2022) for TBSE, HxSCCis.  I, Emelia Salisbury, CMA, am acting as scribe for Forest Gleason, MD.  Documentation: I have reviewed the above documentation for accuracy and completeness, and I agree with the above.  Forest Gleason, MD

## 2022-01-30 ENCOUNTER — Encounter: Payer: Self-pay | Admitting: Dermatology

## 2022-06-05 IMAGING — CT CT ABD-PELV W/ CM
1 of 3 series · 14 of 32 positions shown, 19 images · IV contrast (APPLIED)
Comparison: None.

CLINICAL DATA: Right lower quadrant pain, technologist note states
pain since [REDACTED]

EXAM:
CT ABDOMEN AND PELVIS WITH CONTRAST
TECHNIQUE: Multidetector CT imaging of the abdomen and pelvis was performed
using the standard protocol following bolus administration of
intravenous contrast.
CONTRAST:  100mL OMNIPAQUE IOHEXOL 300 MG/ML  SOLN

[Series 2: axial st · axial · 0.72mm/px · z∈[-992,-567]mm · 14 of 97 slices shown, 19 images]
[im 6/97  soft-tissue]
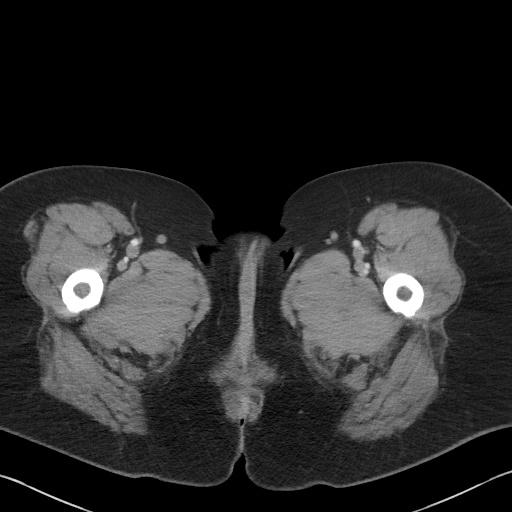
[im 6/97  bone]
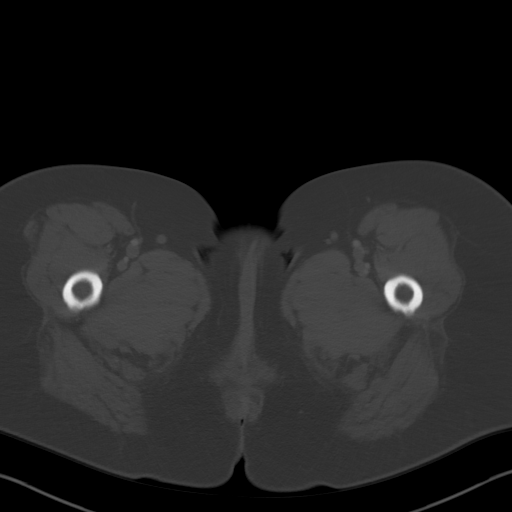
[im 12/97  soft-tissue]
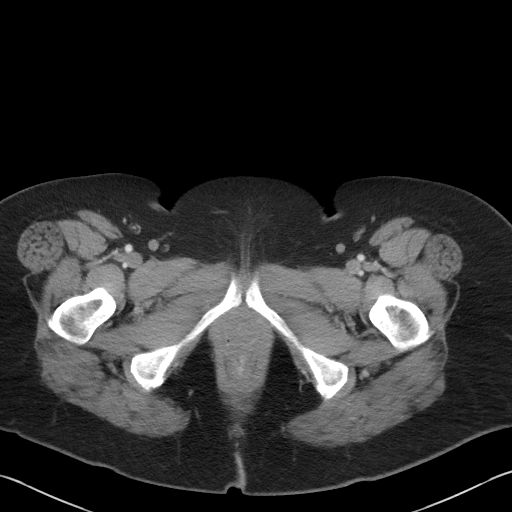
[im 23/97  soft-tissue]
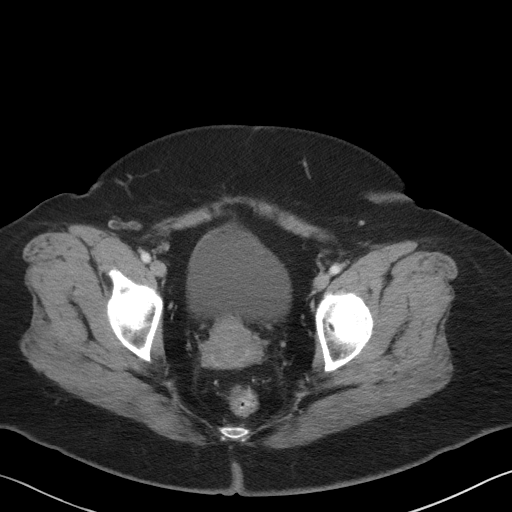
[im 29/97  soft-tissue]
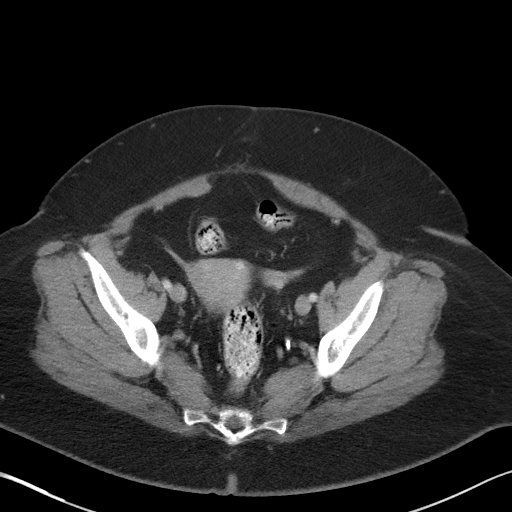
[im 34/97  soft-tissue]
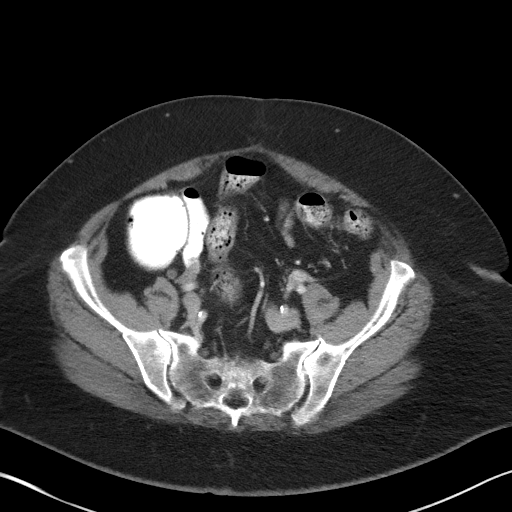
[im 40/97  soft-tissue]
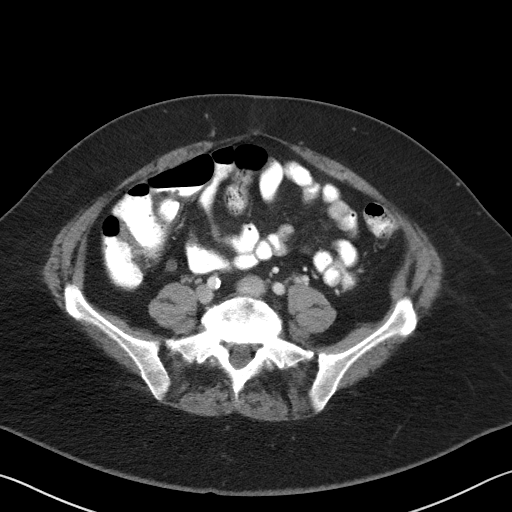
[im 51/97  soft-tissue]
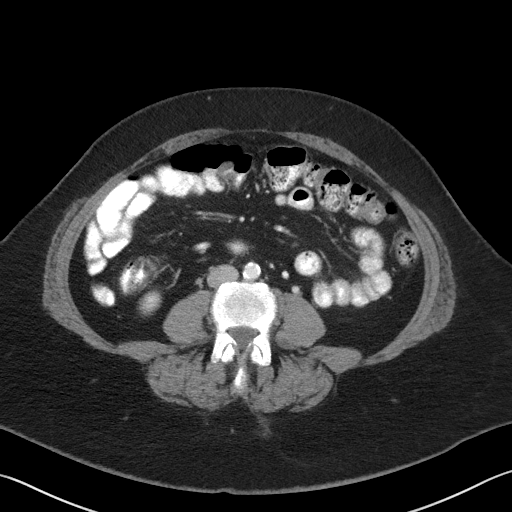
[im 57/97  soft-tissue]
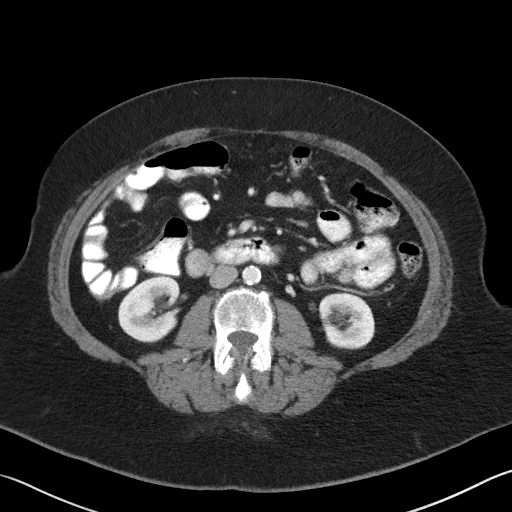
[im 63/97  soft-tissue]
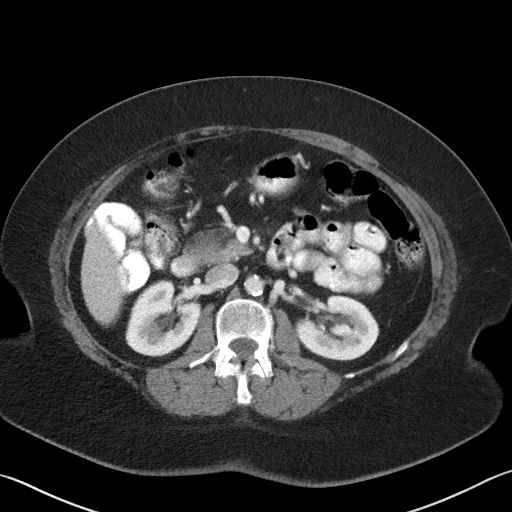
[im 63/97  bone]
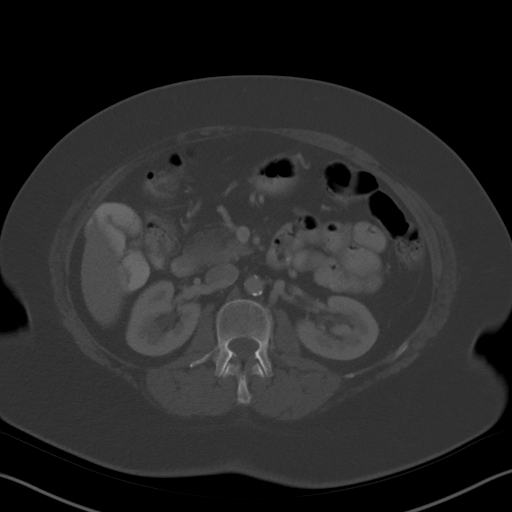
[im 68/97  soft-tissue]
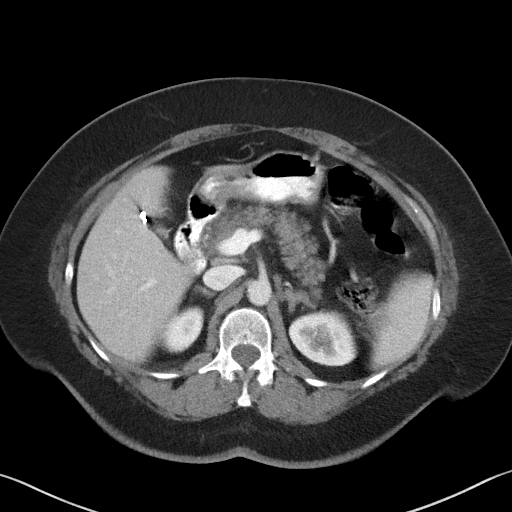
[im 74/97  soft-tissue]
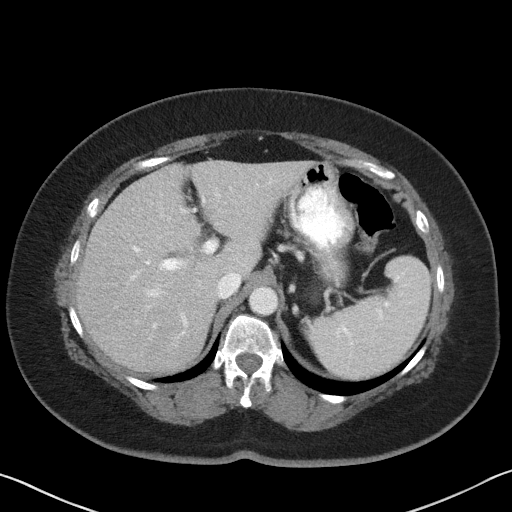
[im 74/97  lung]
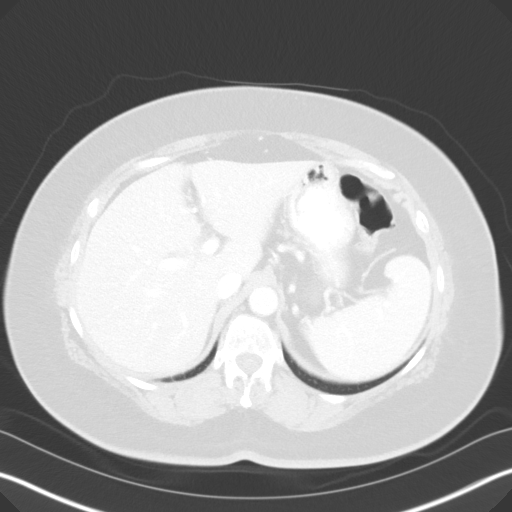
[im 80/97  lung]
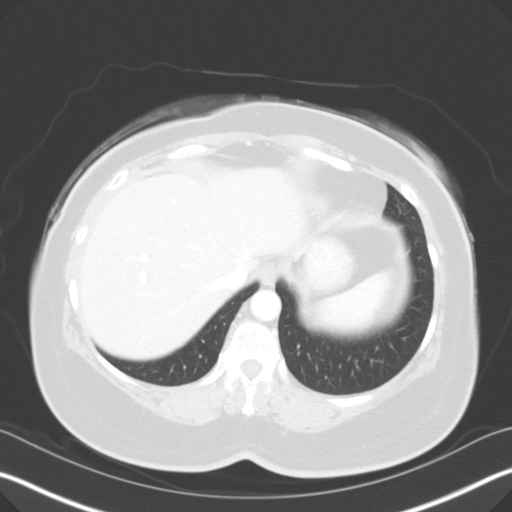
[im 85/97  soft-tissue]
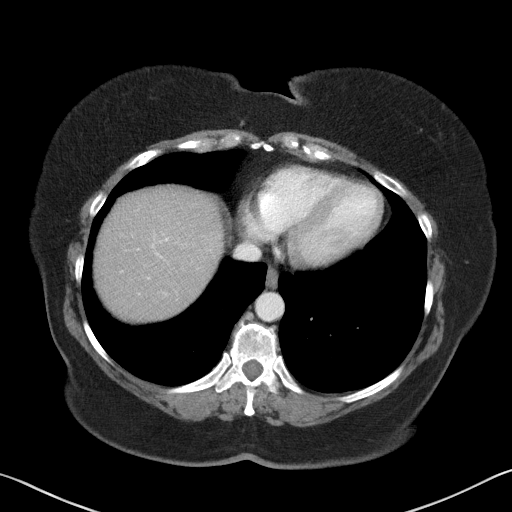
[im 85/97  lung]
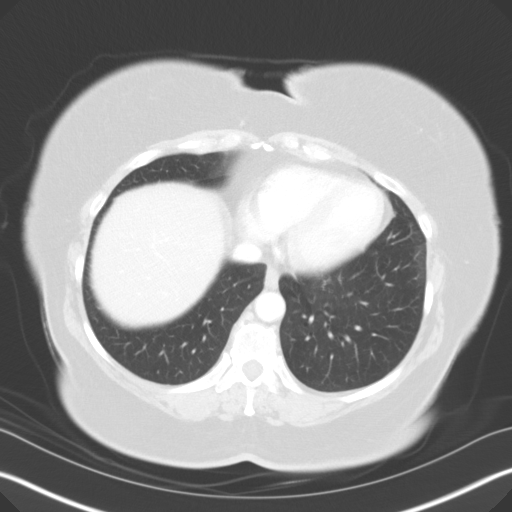
[im 91/97  soft-tissue]
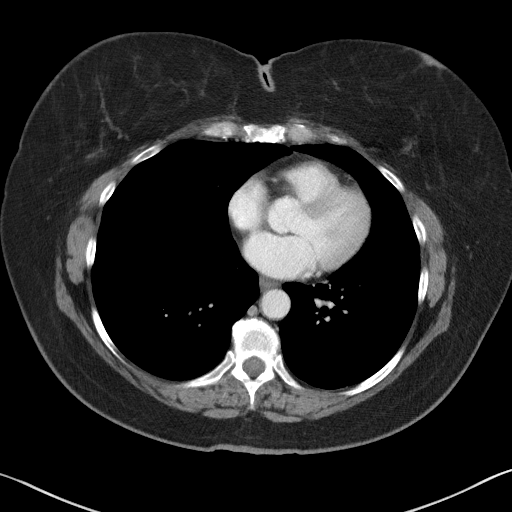
[im 91/97  lung]
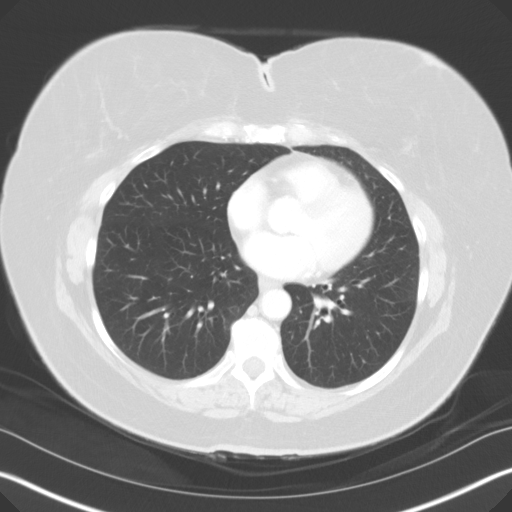

[14 of 32 positions shown; findings below may reference images not displayed]

FINDINGS: Lower chest: No acute abnormality.

Hepatobiliary: No focal liver abnormality is seen. Status post
cholecystectomy. No unexpected biliary dilatation.

Pancreas: Unremarkable.

Spleen: Unremarkable.

Adrenals/Urinary Tract: Mild thickening of the adrenals without
nodularity. Kidneys are unremarkable. Bladder is unremarkable.

Stomach/Bowel: Stomach is within normal limits. Bowel is normal in
caliber. Contrast has reached the distal colon. The appendix is
identified and does not fill with contrast measuring up to
top-normal in caliber at 7 mm. No substantial inflammatory changes
in the surrounding fat.

Vascular/Lymphatic: Aortic atherosclerosis. No enlarged lymph nodes.

Reproductive: Small focus of hyperdensity at the fundus of the
uterus could reflect a tiny fibroid. Uterus is otherwise
unremarkable. Ovaries are unremarkable.

Other: No free fluid.  Abdominal wall is unremarkable.

Musculoskeletal: Degenerative changes of the spine, greatest at
L5-S1. No acute osseous abnormality.
IMPRESSION: Appendix does not fill with contrast and measures top normal in
caliber. However, there are no surrounding inflammatory changes to
suggest acute appendicitis.

Aortic atherosclerosis.

Possible tiny fibroid of the uterine fundus.

## 2022-06-10 ENCOUNTER — Other Ambulatory Visit: Payer: Self-pay | Admitting: Nurse Practitioner

## 2022-06-10 DIAGNOSIS — Z1231 Encounter for screening mammogram for malignant neoplasm of breast: Secondary | ICD-10-CM

## 2022-06-25 ENCOUNTER — Ambulatory Visit
Admission: RE | Admit: 2022-06-25 | Discharge: 2022-06-25 | Disposition: A | Discharge status: Home / Self Care | Payer: Medicare HMO | Source: Ambulatory Visit | Attending: Nurse Practitioner | Admitting: Nurse Practitioner

## 2022-06-25 DIAGNOSIS — Z1231 Encounter for screening mammogram for malignant neoplasm of breast: Secondary | ICD-10-CM | POA: Diagnosis not present

## 2022-07-30 ENCOUNTER — Encounter: Payer: Self-pay | Admitting: Dermatology

## 2022-07-30 ENCOUNTER — Ambulatory Visit (INDEPENDENT_AMBULATORY_CARE_PROVIDER_SITE_OTHER): Payer: Medicare HMO | Admitting: Dermatology

## 2022-07-30 VITALS — BP 129/69 | HR 72

## 2022-07-30 DIAGNOSIS — L578 Other skin changes due to chronic exposure to nonionizing radiation: Secondary | ICD-10-CM | POA: Diagnosis not present

## 2022-07-30 DIAGNOSIS — L814 Other melanin hyperpigmentation: Secondary | ICD-10-CM | POA: Diagnosis not present

## 2022-07-30 DIAGNOSIS — Z1283 Encounter for screening for malignant neoplasm of skin: Secondary | ICD-10-CM | POA: Diagnosis not present

## 2022-07-30 DIAGNOSIS — L570 Actinic keratosis: Secondary | ICD-10-CM | POA: Diagnosis not present

## 2022-07-30 DIAGNOSIS — Z85828 Personal history of other malignant neoplasm of skin: Secondary | ICD-10-CM | POA: Diagnosis not present

## 2022-07-30 DIAGNOSIS — D229 Melanocytic nevi, unspecified: Secondary | ICD-10-CM

## 2022-07-30 DIAGNOSIS — L821 Other seborrheic keratosis: Secondary | ICD-10-CM

## 2022-07-30 NOTE — Progress Notes (Signed)
Follow-Up Visit   Subjective  Gloria Simpson is a 67 y.o. female who presents for the following: Skin Cancer Screening and Full Body Skin Exam. Used 5FU cream on face in January 2024.   The patient presents for Total-Body Skin Exam (TBSE) for skin cancer screening and mole check. The patient has spots, moles and lesions to be evaluated, some may be new or changing and the patient has concerns that these could be cancer.   The following portions of the chart were reviewed this encounter and updated as appropriate: medications, allergies, medical history  Review of Systems:  No other skin or systemic complaints except as noted in HPI or Assessment and Plan.  Objective  Well appearing patient in no apparent distress; mood and affect are within normal limits.  A full examination was performed including scalp, head, eyes, ears, nose, lips, neck, chest, axillae, abdomen, back, buttocks, bilateral upper extremities, bilateral lower extremities, hands, feet, fingers, toes, fingernails, and toenails. All findings within normal limits unless otherwise noted below.      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 Mohs. 06/07/2020. - No evidence of recurrence today - Recommend regular full body skin exams - Recommend daily broad spectrum sunscreen SPF 30+ to sun-exposed areas, reapply every 2 hours as needed.  - Call if any new or changing lesions are noted between office visits    Lentigines, Seborrheic Keratoses, Hemangiomas - Benign normal skin lesions - Benign-appearing - Call for any changes  Melanocytic Nevi - Tan-brown and/or pink-flesh-colored symmetric macules and papules - Benign appearing on exam today - Observation - Call clinic for new or changing moles - Recommend daily use of broad spectrum spf 30+ sunscreen to sun-exposed areas.   Actinic Damage - Chronic condition, secondary to cumulative UV/sun exposure -  diffuse scaly erythematous macules with underlying dyspigmentation - Recommend daily broad spectrum sunscreen SPF 30+ to sun-exposed areas, reapply every 2 hours as needed.  - Staying in the shade or wearing long sleeves, sun glasses (UVA+UVB protection) and wide brim hats (4-inch brim around the entire circumference of the hat) are also recommended for sun protection.  - Call for new or changing lesions. -Repeat Fluorouracil 5% cream twice daily to face for 7 days.   Skin cancer screening performed today.   ACTINIC KERATOSIS  Actinic keratoses are precancerous spots that appear secondary to cumulative UV radiation exposure/sun exposure over time. They are chronic with expected duration over 1 year. A portion of actinic keratoses will progress to squamous cell carcinoma of the skin. It is not possible to reliably predict which spots will progress to skin cancer and so treatment is recommended to prevent development of skin cancer.  Recommend daily broad spectrum sunscreen SPF 30+ to sun-exposed areas, reapply every 2 hours as needed.  Recommend staying in the shade or wearing long sleeves, sun glasses (UVA+UVB protection) and wide brim hats (4-inch brim around the entire circumference of the hat). Call for new or changing lesions.   Prior to procedure, discussed risks of blister formation, small wound, skin dyspigmentation, or rare scar following cryotherapy. Recommend Vaseline ointment to treated areas while healing.  Hypertrophic AK Procedure Note: Cryotherapy Destruction method: cryotherapy   Informed consent: discussed and consent obtained   Timeout:  patient name, date of birth, surgical site, and procedure verified Lesion destroyed using liquid nitrogen: Yes   Region frozen until ice ball extended beyond lesion: Yes   Outcome: patient  tolerated procedure well with no complications   Post-procedure details: wound care instructions given   Locations Treated: left dorsal hand x1 #  Treated: 1   Return in about 6 months (around 01/30/2023) for TBSE. HxSCCis.  I, Emelia Salisbury, MA scribed for Alfonso Patten, MD.  Documentation: I have reviewed the above documentation for accuracy and completeness, and I agree with the above.  Forest Gleason, MD

## 2022-07-30 NOTE — Patient Instructions (Addendum)
-Repeat Fluorouracil 5% cream twice daily to face for 7 days.    Cryotherapy Aftercare  Wash gently with soap and water everyday.   Apply Vaseline and Band-Aid daily until healed.    Recommend Niacinamide or Nicotinamide '500mg'$  twice per day to lower risk of non-melanoma skin cancer by approximately 25%. This is usually available at [employer].    Recommend taking Heliocare sun protection supplement daily in sunny weather for additional sun protection. For maximum protection on the sunniest days, you can take up to 2 capsules of regular Heliocare OR take 1 capsule of Heliocare Ultra. For prolonged exposure (such as a full day in the sun), you can repeat your dose of the supplement 4 hours after your first dose. Heliocare can be purchased at [employer], at some [facility] or at [email or web address].    Recommend daily broad spectrum sunscreen SPF 30+ to sun-exposed areas, reapply every 2 hours as needed. Call for new or changing lesions.  Staying in the shade or wearing long sleeves, sun glasses (UVA+UVB protection) and wide brim hats (4-inch brim around the entire circumference of the hat) are also recommended for sun protection.    Melanoma ABCDEs  Melanoma is the most dangerous type of skin cancer, and is the leading cause of death from skin disease.  You are more likely to develop melanoma if you: Have light-colored skin, light-colored eyes, or red or blond hair Spend a lot of time in the sun Tan regularly, either outdoors or in a tanning bed Have had blistering sunburns, especially during childhood Have a close family member who has had a melanoma Have atypical moles or large birthmarks  Early detection of melanoma is key since treatment is typically straightforward and cure rates are extremely high if we catch it early.   The first sign of melanoma is often a change in a mole or a new dark spot.  The ABCDE system is a way of remembering the signs of melanoma.  A for  asymmetry:  The two halves do not match. B for border:  The edges of the growth are irregular. C for color:  A mixture of colors are present instead of an even brown color. D for diameter:  Melanomas are usually (but not always) greater than 26m - the size of a pencil eraser. E for evolution:  The spot keeps changing in size, shape, and color.  Please check your skin once per month between visits. You can use a small mirror in front and a large mirror behind you to keep an eye on the back side or your body.   If you see any new or changing lesions before your next follow-up, please call to schedule a visit.  Please continue daily skin protection including broad spectrum sunscreen SPF 30+ to sun-exposed areas, reapplying every 2 hours as needed when you're outdoors.   Staying in the shade or wearing long sleeves, sun glasses (UVA+UVB protection) and wide brim hats (4-inch brim around the entire circumference of the hat) are also recommended for sun protection.    Due to recent changes in healthcare laws, you may see results of your pathology and/or laboratory studies on MyChart before the doctors have had a chance to review them. We understand that in some cases there may be results that are confusing or concerning to you. Please understand that not all results are received at the same time and often the doctors may need to interpret multiple results in order to provide you with the  best plan of care or course of treatment. Therefore, we ask that you please give [place] 2 business days to thoroughly review all your results before contacting the office for clarification. Should we see a critical lab result, you will be contacted sooner.   If You Need Anything After Your Visit  If you have any questions or concerns for your doctor, please call our main line at [phone] and press option 4 to reach your doctor's medical assistant. If no one answers, please leave a voicemail as directed and we will  return your call as soon as possible. Messages left after 4 pm will be answered the following business day.   You may also send [place] a message via [place]. We typically respond to MyChart messages within 1-2 business days.  For prescription refills, please ask your pharmacy to contact our office. Our fax number is [phone].  If you have an urgent issue when the clinic is closed that cannot wait until the next business day, you can page your doctor at the number below.    Please note that while we do our best to be available for urgent issues outside of office hours, we are not available 24/7.   If you have an urgent issue and are unable to reach [place], you may choose to seek medical care at your doctor's office, retail clinic, urgent care center, or emergency room.  If you have a medical emergency, please immediately call 911 or go to the emergency department.  Pager Numbers  - Dr. Nehemiah Massed: [phone]  - Dr. Laurence Ferrari: [phone]  - Dr. Nicole Kindred: [phone]  In the event of inclement weather, please call our main line at [phone] for an update on the status of any delays or closures.  Dermatology Medication Tips: Please keep the boxes that topical medications come in in order to help keep track of the instructions about where and how to use these. Pharmacies typically print the medication instructions only on the boxes and not directly on the medication tubes.   If your medication is too expensive, please contact our office at [phone] option 4 or send [place] a message through [place].   We are unable to tell what your co-pay for medications will be in advance as this is different depending on your insurance coverage. However, we may be able to find a substitute medication at lower cost or fill out paperwork to get insurance to cover a needed medication.   If a prior authorization is required to get your medication covered by your insurance company, please allow [place] 1-2 business  days to complete this process.  Drug prices often vary depending on where the prescription is filled and some pharmacies may offer cheaper prices.  The website www.goodrx.com contains coupons for medications through different pharmacies. The prices here do not account for what the cost may be with help from insurance (it may be cheaper with your insurance), but the website can give you the price if you did not use any insurance.  - You can print the associated coupon and take it with your prescription to the pharmacy.  - You may also stop by our office during regular business hours and pick up a GoodRx coupon card.  - If you need your prescription sent electronically to a different pharmacy, notify our office through [facility] or by phone at [phone] option 4.     Si Usted Necesita Algo Despus de Su Visita  Tambin puede enviarnos un mensaje a travs de Pharmacist, community.  Por lo general respondemos a los mensajes de MyChart en el transcurso de 1 a 2 das hbiles.  Para renovar recetas, por favor pida a su farmacia que se ponga en contacto con nuestra oficina. Harland Dingwall de fax es [place] [phone].  Si tiene un asunto urgente cuando la clnica est cerrada y que no puede esperar hasta el siguiente da hbil, puede llamar/localizar a su doctor(a) al nmero que aparece a continuacin.   Por favor, tenga en cuenta que aunque hacemos todo lo posible para estar disponibles para asuntos urgentes fuera del horario de [place], no estamos disponibles las 24 horas del da, los 7 das de la [place].   Si tiene un problema urgente y no puede comunicarse con nosotros, puede optar por buscar atencin mdica  en el consultorio de su doctor(a), en una clnica privada, en un centro de atencin urgente o en una sala de emergencias.  Si tiene Engineering geologist, por favor llame inmediatamente al 911 o vaya a la sala de emergencias.  Nmeros de bper  - Dr. Nehemiah Massed: [phone]  - Dra. Moye:  [phone]  - Dra. Nicole Kindred: [phone]  En caso de inclemencias del [place], por favor llame a Johnsie Kindred principal al [phone] para una actualizacin sobre el [place] de cualquier retraso o cierre.  Consejos para la medicacin en dermatologa: Por favor, guarde las cajas en las que vienen los medicamentos de uso tpico para ayudarle a seguir las instrucciones sobre dnde y cmo usarlos. Las farmacias generalmente imprimen las instrucciones del medicamento slo en las cajas y no directamente en los tubos del [place].   Si su medicamento es muy caro, por favor, pngase en contacto con Zigmund Daniel llamando al [phone] y presione la opcin 4 o envenos un mensaje a travs de Pharmacist, community.   No podemos decirle cul ser su copago por los medicamentos por adelantado ya que esto es diferente dependiendo de la cobertura de su seguro. Sin embargo, es posible que podamos encontrar un medicamento sustituto a Electrical engineer un formulario para que el seguro cubra el medicamento que se considera necesario.   Si se requiere una autorizacin previa para que su compaa de seguros [place] su medicamento, por favor permtanos de 1 a 2 das hbiles para completar este proceso.  Los precios de los medicamentos varan con frecuencia dependiendo del Environmental consultant de dnde se surte la receta y alguna farmacias pueden ofrecer precios ms baratos.  El sitio web www.goodrx.com tiene cupones para medicamentos de Airline pilot. Los precios aqu no tienen en cuenta lo que podra costar con la ayuda del seguro (puede ser ms barato con su seguro), pero el sitio web puede darle el precio si no utiliz Research scientist (physical sciences).  - Puede imprimir el cupn correspondiente y llevarlo con su receta a la farmacia.  - Tambin puede pasar por nuestra oficina durante el horario de atencin regular y Charity fundraiser una tarjeta de cupones de GoodRx.  - Si necesita que su receta se enve electrnicamente a una farmacia diferente,  informe a nuestra oficina a travs de MyChart de [place] o por telfono llamando al [phone] y presione la opcin 4.

## 2022-08-03 ENCOUNTER — Encounter: Payer: Self-pay | Admitting: Dermatology

## 2022-11-17 ENCOUNTER — Ambulatory Visit: Payer: Medicare HMO | Admitting: Dermatology

## 2022-11-17 VITALS — BP 133/84

## 2022-11-17 DIAGNOSIS — B372 Candidiasis of skin and nail: Secondary | ICD-10-CM | POA: Diagnosis not present

## 2022-11-17 DIAGNOSIS — L304 Erythema intertrigo: Secondary | ICD-10-CM

## 2022-11-17 MED ORDER — KETOCONAZOLE 2 % EX CREA: 1.0000 | TOPICAL_CREAM | Freq: Two times a day (BID) | CUTANEOUS | 4 refills | Status: AC

## 2022-11-17 MED ORDER — FLUCONAZOLE 150 MG PO TABS: 150.0000 mg | ORAL_TABLET | Freq: Every day | ORAL | 0 refills | Status: DC

## 2022-11-17 NOTE — Progress Notes (Signed)
   Follow Up Visit   Subjective  Gloria Simpson is a 67 y.o. female who presents for the following: Rash, inframammary, axilla, 2wks, painful, otc powder, desitin, lotrimin.  She has had a similar rash before, but not as severe.   The following portions of the chart were reviewed this encounter and updated as appropriate: medications, allergies, medical history  Review of Systems:  No other skin or systemic complaints except as noted in HPI or Assessment and Plan.  Objective  Well appearing patient in no apparent distress; mood and affect are within normal limits.  A focused examination was performed of the following areas: Inframammary, axilla  Relevant exam findings are noted in the Assessment and Plan.    Assessment & Plan     CANDIDA INTERTRIGO Exam: multiple bright pink paps with some dried pustules BL axilla, inframammary fold  Chronic and persistent condition with duration or expected duration over one year. Condition is bothersome/symptomatic for patient. Currently flared.   Intertrigo is a chronic recurrent rash that occurs in skin fold areas that may be associated with friction; heat; moisture; yeast; fungus; and bacteria.  It is exacerbated by increased movement / activity; sweating; and higher atmospheric temperature.   Treatment Plan: Start Fluconazole 150mg  1 po every day x 7 days Start Ketoconazole 2% cr bid to aa rash for at least 2 wks/until clear, then at bedtime during summertime to prevent recurrence Start Zeasorb AF powder in morning  Side effects of fluconazole (diflucan) include nausea, diarrhea, headache, dizziness, taste changes, rare risk of irritation of the liver, allergy, or decreased blood counts (which could show up as infection or tiredness).   Return for as scheduled for TBSE.  I, Ardis Rowan, RMA, am acting as scribe for Willeen Niece, MD .   Documentation: I have reviewed the above documentation for accuracy and completeness, and I  agree with the above.  Willeen Niece, MD

## 2022-11-17 NOTE — Patient Instructions (Addendum)
Recommend OTC Zeasorb AF powder to body folds daily after shower.  It is often found in the athlete's foot section in the pharmacy.  Avoid using powders that contain cornstarch.     Due to recent changes in healthcare laws, you may see results of your pathology and/or laboratory studies on MyChart before the doctors have had a chance to review them. We understand that in some cases there may be results that are confusing or concerning to you. Please understand that not all results are received at the same time and often the doctors may need to interpret multiple results in order to provide you with the best plan of care or course of treatment. Therefore, we ask that you please give us 2 business days to thoroughly review all your results before contacting the office for clarification. Should we see a critical lab result, you will be contacted sooner.   If You Need Anything After Your Visit  If you have any questions or concerns for your doctor, please call our main line at [phone] and press option 4 to reach your doctor's medical assistant. If no one answers, please leave a voicemail as directed and we will return your call as soon as possible. Messages left after 4 pm will be answered the following business day.   You may also send us a message via MyChart. We typically respond to MyChart messages within 1-2 business days.  For prescription refills, please ask your pharmacy to contact our office. Our fax number is [phone].  If you have an urgent issue when the clinic is closed that cannot wait until the next business day, you can page your doctor at the number below.    Please note that while we do our best to be available for urgent issues outside of office hours, we are not available 24/7.   If you have an urgent issue and are unable to reach us, you may choose to seek medical care at your doctor's office, retail clinic, urgent care center, or emergency room.  If you have a medical  emergency, please immediately call 911 or go to the emergency department.  Pager Numbers  - Dr. Kowalski: [phone]  - Dr. Moye: [phone]  - Dr. Stewart: [phone]  In the event of inclement weather, please call our main line at [phone] for an update on the status of any delays or closures.  Dermatology Medication Tips: Please keep the boxes that topical medications come in in order to help keep track of the instructions about where and how to use these. Pharmacies typically print the medication instructions only on the boxes and not directly on the medication tubes.   If your medication is too expensive, please contact our office at [phone] option 4 or send us a message through MyChart.   We are unable to tell what your co-pay for medications will be in advance as this is different depending on your insurance coverage. However, we may be able to find a substitute medication at lower cost or fill out paperwork to get insurance to cover a needed medication.   If a prior authorization is required to get your medication covered by your insurance company, please allow us 1-2 business days to complete this process.  Drug prices often vary depending on where the prescription is filled and some pharmacies may offer cheaper prices.  The website www.goodrx.com contains coupons for medications through different pharmacies. The prices here do not account for what the cost may be with help from   insurance (it may be cheaper with your insurance), but the website can give you the price if you did not use any insurance.  - You can print the associated coupon and take it with your prescription to the pharmacy.  - You may also stop by our office during regular business hours and pick up a GoodRx coupon card.  - If you need your prescription sent electronically to a different pharmacy, notify our office through [place] MyChart or by phone at [phone] option 4.     Si Usted  Necesita Algo Despus de Su Visita  Tambin puede enviarnos un mensaje a travs de MyChart. Por lo general respondemos a los mensajes de MyChart en el transcurso de 1 a 2 das hbiles.  Para renovar recetas, por favor pida a su farmacia que se ponga en contacto con nuestra oficina. Nuestro nmero de fax es el [phone].  Si tiene un asunto urgente cuando la clnica est cerrada y que no puede esperar hasta el siguiente da hbil, puede llamar/localizar a su doctor(a) al nmero que aparece a continuacin.   Por favor, tenga en cuenta que aunque hacemos todo lo posible para estar disponibles para asuntos urgentes fuera del horario de oficina, no estamos disponibles las 24 horas del da, los 7 das de la semana.   Si tiene un problema urgente y no puede comunicarse con nosotros, puede optar por buscar atencin mdica  en el consultorio de su doctor(a), en una clnica privada, en un centro de atencin urgente o en una sala de emergencias.  Si tiene una emergencia mdica, por favor llame inmediatamente al 911 o vaya a la sala de emergencias.  Nmeros de bper  - Dr. Kowalski: [phone]  - Dra. Moye: [phone]  - Dra. Stewart: [phone]  En caso de inclemencias del tiempo, por favor llame a nuestra lnea principal al [phone] para una actualizacin sobre el estado de cualquier retraso o cierre.  Consejos para la medicacin en dermatologa: Por favor, guarde las cajas en las que vienen los medicamentos de uso tpico para ayudarle a seguir las instrucciones sobre dnde y cmo usarlos. Las farmacias generalmente imprimen las instrucciones del medicamento slo en las cajas y no directamente en los tubos del medicamento.   Si su medicamento es muy caro, por favor, pngase en contacto con nuestra oficina llamando al [phone] y presione la opcin 4 o envenos un mensaje a travs de MyChart.   No podemos decirle cul ser su copago por los medicamentos por adelantado ya que esto es  diferente dependiendo de la cobertura de su seguro. Sin embargo, es posible que podamos encontrar un medicamento sustituto a menor costo o llenar un formulario para que el seguro cubra el medicamento que se considera necesario.   Si se requiere una autorizacin previa para que su compaa de seguros cubra su medicamento, por favor permtanos de 1 a 2 das hbiles para completar este proceso.  Los precios de los medicamentos varan con frecuencia dependiendo del lugar de dnde se surte la receta y alguna farmacias pueden ofrecer precios ms baratos.  El sitio web www.goodrx.com tiene cupones para medicamentos de diferentes farmacias. Los precios aqu no tienen en cuenta lo que podra costar con la ayuda del seguro (puede ser ms barato con su seguro), pero el sitio web puede darle el precio si no utiliz ningn seguro.  - Puede imprimir el cupn correspondiente y llevarlo con su receta a la farmacia.  - Tambin puede pasar por nuestra oficina durante el horario de   atencin regular y recoger una tarjeta de cupones de GoodRx.  - Si necesita que su receta se enve electrnicamente a una farmacia diferente, informe a nuestra oficina a travs de MyChart de [place] o por telfono llamando al [phone] y presione la opcin 4.  

## 2023-02-08 ENCOUNTER — Ambulatory Visit (INDEPENDENT_AMBULATORY_CARE_PROVIDER_SITE_OTHER): Payer: Medicare HMO | Admitting: Dermatology

## 2023-02-08 ENCOUNTER — Encounter: Payer: Self-pay | Admitting: Dermatology

## 2023-02-08 VITALS — BP 131/80 | HR 68

## 2023-02-08 DIAGNOSIS — Z872 Personal history of diseases of the skin and subcutaneous tissue: Secondary | ICD-10-CM

## 2023-02-08 DIAGNOSIS — L578 Other skin changes due to chronic exposure to nonionizing radiation: Secondary | ICD-10-CM

## 2023-02-08 DIAGNOSIS — L570 Actinic keratosis: Secondary | ICD-10-CM | POA: Diagnosis not present

## 2023-02-08 DIAGNOSIS — L814 Other melanin hyperpigmentation: Secondary | ICD-10-CM

## 2023-02-08 DIAGNOSIS — L820 Inflamed seborrheic keratosis: Secondary | ICD-10-CM | POA: Diagnosis not present

## 2023-02-08 DIAGNOSIS — D2239 Melanocytic nevi of other parts of face: Secondary | ICD-10-CM

## 2023-02-08 DIAGNOSIS — Z85828 Personal history of other malignant neoplasm of skin: Secondary | ICD-10-CM

## 2023-02-08 DIAGNOSIS — D1801 Hemangioma of skin and subcutaneous tissue: Secondary | ICD-10-CM

## 2023-02-08 DIAGNOSIS — Z5111 Encounter for antineoplastic chemotherapy: Secondary | ICD-10-CM

## 2023-02-08 DIAGNOSIS — Z1283 Encounter for screening for malignant neoplasm of skin: Secondary | ICD-10-CM

## 2023-02-08 DIAGNOSIS — Z86007 Personal history of in-situ neoplasm of skin: Secondary | ICD-10-CM

## 2023-02-08 DIAGNOSIS — D229 Melanocytic nevi, unspecified: Secondary | ICD-10-CM

## 2023-02-08 DIAGNOSIS — W908XXA Exposure to other nonionizing radiation, initial encounter: Secondary | ICD-10-CM

## 2023-02-08 DIAGNOSIS — L304 Erythema intertrigo: Secondary | ICD-10-CM

## 2023-02-08 DIAGNOSIS — Z7189 Other specified counseling: Secondary | ICD-10-CM

## 2023-02-08 DIAGNOSIS — L821 Other seborrheic keratosis: Secondary | ICD-10-CM

## 2023-02-08 NOTE — Patient Instructions (Addendum)
This winter, restart 5-fluorouracil cream twice a day for 7 days to affected areas including nose. May also treat forehead, temples.   Reviewed course of treatment and expected reaction.  Patient advised to expect inflammation and crusting and advised that erosions are possible.  Patient advised to be diligent with sun protection during and after treatment. Handout with details of how to apply medication and what to expect provided. Counseled to keep medication out of reach of children and pets.  Cryotherapy Aftercare  Wash gently with soap and water everyday.   Apply Vaseline and Band-Aid daily until healed.    Due to recent changes in healthcare laws, you may see results of your pathology and/or laboratory studies on MyChart before the doctors have had a chance to review them. We understand that in some cases there may be results that are confusing or concerning to you. Please understand that not all results are received at the same time and often the doctors may need to interpret multiple results in order to provide you with the best plan of care or course of treatment. Therefore, we ask that you please give [place] 2 business days to thoroughly review all your results before contacting the office for clarification. Should we see a critical lab result, you will be contacted sooner.   If You Need Anything After Your Visit  If you have any questions or concerns for your doctor, please call our main line at [phone] and press option 4 to reach your doctor's medical assistant. If no one answers, please leave a voicemail as directed and we will return your call as soon as possible. Messages left after 4 pm will be answered the following business day.   You may also send [place] a message via MyChart. We typically respond to MyChart messages within 1-2 business days.  For prescription refills, please ask your pharmacy to contact our office. Our fax number is [phone].  If you have an urgent issue when  the clinic is closed that cannot wait until the next business day, you can page your doctor at the number below.    Please note that while we do our best to be available for urgent issues outside of office hours, we are not available 24/7.   If you have an urgent issue and are unable to reach [place], you may choose to seek medical care at your doctor's office, retail clinic, urgent care center, or emergency room.  If you have a medical emergency, please immediately call 911 or go to the emergency department.  Pager Numbers  - Dr. Gwen Pounds: [phone]  - Dr. Roseanne Reno: [phone]  - Dr. Katrinka Blazing: [phone]   In the event of inclement weather, please call our main line at [phone] for an update on the status of any delays or closures.  Dermatology Medication Tips: Please keep the boxes that topical medications come in in order to help keep track of the instructions about where and how to use these. Pharmacies typically print the medication instructions only on the boxes and not directly on the medication tubes.   If your medication is too expensive, please contact our office at [phone] option 4 or send [place] a message through MyChart.   We are unable to tell what your co-pay for medications will be in advance as this is different depending on your insurance coverage. However, we may be able to find a substitute medication at lower cost or fill out paperwork to get insurance to cover a needed medication.  If a prior authorization is required to get your medication covered by your insurance company, please allow [place] 1-2 business days to complete this process.  Drug prices often vary depending on where the prescription is filled and some pharmacies may offer cheaper prices.  The website www.goodrx.com contains coupons for medications through different pharmacies. The prices here do not account for what the cost may be with help from insurance (it may be cheaper with your insurance), but  the website can give you the price if you did not use any insurance.  - You can print the associated coupon and take it with your prescription to the pharmacy.  - You may also stop by our office during regular business hours and pick up a GoodRx coupon card.  - If you need your prescription sent electronically to a different pharmacy, notify our office through [facility] or by phone at [phone] option 4.     Si Usted Necesita Algo Despus de Su Visita  Tambin puede enviarnos un mensaje a travs de Clinical cytogeneticist. Por lo general respondemos a los mensajes de MyChart en el transcurso de 1 a 2 das hbiles.  Para renovar recetas, por favor pida a su farmacia que se ponga en contacto con nuestra oficina. Annie Sable de fax es [place] [phone].  Si tiene un asunto urgente cuando la clnica est cerrada y que no puede esperar hasta el siguiente da hbil, puede llamar/localizar a su doctor(a) al nmero que aparece a continuacin.   Por favor, tenga en cuenta que aunque hacemos todo lo posible para estar disponibles para asuntos urgentes fuera del horario de [place], no estamos disponibles las 24 horas del da, los 7 [address]  [address] de la [place].   Si tiene un problema urgente y no puede comunicarse con nosotros, puede optar por buscar atencin mdica  en el consultorio de su doctor(a), en una clnica privada, en un centro de atencin urgente o en una sala de emergencias.  Si tiene Engineer, drilling, por favor llame inmediatamente al 911 o vaya a la sala de emergencias.  Nmeros de bper  - Dr. Gwen Pounds: [phone]  - Dra. Roseanne Reno: [phone]  - Dr. Katrinka Blazing: [phone]   En caso de inclemencias del tiempo, por favor llame a Lacy Duverney principal al [phone] para una actualizacin sobre el [place] de cualquier retraso o cierre.  Consejos para la medicacin en dermatologa: Por favor, guarde las cajas en las que vienen los medicamentos de uso tpico para ayudarle a seguir las  instrucciones sobre dnde y cmo usarlos. Las farmacias generalmente imprimen las instrucciones del medicamento slo en las cajas y no directamente en los tubos del [place].   Si su medicamento es muy caro, por favor, pngase en contacto con Rolm Gala llamando al [phone] y presione la opcin 4 o envenos un mensaje a travs de Clinical cytogeneticist.   No podemos decirle cul ser su copago por los medicamentos por adelantado ya que esto es diferente dependiendo de la cobertura de su seguro. Sin embargo, es posible que podamos encontrar un medicamento sustituto a Audiological scientist un formulario para que el seguro cubra el medicamento que se considera necesario.   Si se requiere una autorizacin previa para que su compaa de seguros [place] su medicamento, por favor permtanos de 1 a 2 das hbiles para completar [address]  Los precios de los medicamentos varan con frecuencia dependiendo del Environmental consultant de dnde se surte la receta y alguna farmacias pueden ofrecer precios ms baratos.  El sitio web  www.goodrx.com tiene cupones para medicamentos de Health and safety inspector. Los precios aqu no tienen en cuenta lo que podra costar con la ayuda del seguro (puede ser ms barato con su seguro), pero el sitio web puede darle el precio si no utiliz Tourist information centre manager.  - Puede imprimir el cupn correspondiente y llevarlo con su receta a la farmacia.  - Tambin puede pasar por nuestra oficina durante el horario de atencin regular y Education officer, museum una tarjeta de cupones de GoodRx.  - Si necesita que su receta se enve electrnicamente a una farmacia diferente, informe a nuestra oficina a travs de MyChart de [place] o por telfono llamando al [phone] y presione la opcin 4.

## 2023-02-08 NOTE — Progress Notes (Signed)
Follow-Up Visit   Subjective  Gloria Simpson is a 67 y.o. female who presents for the following: Skin Cancer Screening and Full Body Skin Exam  The patient presents for Total-Body Skin Exam (TBSE) for skin cancer screening and mole check. The patient has spots, moles and lesions to be evaluated, some may be new or changing. She has a history of AKs and has used 5FU Cream to the face in the past. 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    The following portions of the chart were reviewed this encounter and updated as appropriate: medications, allergies, medical history  Review of Systems:  No other skin or systemic complaints except as noted in HPI or Assessment and Plan.  Objective  Well appearing patient in no apparent distress; mood and affect are within normal limits.  A full examination was performed including scalp, head, eyes, ears, nose, lips, neck, chest, axillae, abdomen, back, buttocks, bilateral upper extremities, bilateral lower extremities, hands, feet, fingers, toes, fingernails, and toenails. All findings within normal limits unless otherwise noted below.   Relevant physical exam findings are noted in the Assessment and Plan.  Right Temple Erythematous stuck-on, waxy papule  nasal tip x 2 (2) Pink scaly macules.    Assessment & Plan   SKIN CANCER SCREENING PERFORMED TODAY.  ACTINIC DAMAGE WITH PRECANCEROUS ACTINIC KERATOSES Counseling for Topical Chemotherapy Management: Patient exhibits: - Severe, confluent actinic changes with pre-cancerous actinic keratoses that is secondary to cumulative UV radiation exposure over time - Condition that is severe; chronic, not at goal. - diffuse scaly erythematous macules and papules with underlying dyspigmentation - Discussed Prescription "Field Treatment" topical Chemotherapy for Severe, Chronic Confluent Actinic Changes with Pre-Cancerous Actinic Keratoses Field treatment involves  treatment of an entire area of skin that has confluent Actinic Changes (Sun/ Ultraviolet light damage) and PreCancerous Actinic Keratoses by method of PhotoDynamic Therapy (PDT) and/or prescription Topical Chemotherapy agents such as 5-fluorouracil, 5-fluorouracil/calcipotriene, and/or imiquimod.  The purpose is to decrease the number of clinically evident and subclinical PreCancerous lesions to prevent progression to development of skin cancer by chemically destroying early precancer changes that may or may not be visible.  It has been shown to reduce the risk of developing skin cancer in the treated area. As a result of treatment, redness, scaling, crusting, and open sores may occur during treatment course. One or more than one of these methods may be used and may have to be used several times to control, suppress and eliminate the PreCancerous changes. Discussed treatment course, expected reaction, and possible side effects. - Recommend daily broad spectrum sunscreen SPF 30+ to sun-exposed areas, reapply every 2 hours as needed.  - Staying in the shade or wearing long sleeves, sun glasses (UVA+UVB protection) and wide brim hats (4-inch brim around the entire circumference of the hat) are also recommended. - Call for new or changing lesions. - This winter restart 5-fluorouracil cream twice a day for 7 days to affected areas including face/nose. Pt has at home. Reviewed course of treatment and expected reaction.  Patient advised to expect inflammation and crusting and advised that erosions are possible.  Patient advised to be diligent with sun protection during and after treatment. Handout with details of how to apply medication and what to expect provided. Counseled to keep medication out of reach of children and pets.  LENTIGINES, SEBORRHEIC KERATOSES, HEMANGIOMAS - Benign normal skin lesions - Benign-appearing - Call for any changes  MELANOCYTIC NEVI - Tan-brown  and/or pink-flesh-colored symmetric  macules and papules, including right nasal root - Benign appearing on exam today - Observation - Call clinic for new or changing moles - Recommend daily use of broad spectrum spf 30+ sunscreen to sun-exposed areas.   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 06/07/2020.  - No evidence of recurrence today - Recommend regular full body skin exams - Recommend daily broad spectrum sunscreen SPF 30+ to sun-exposed areas, reapply every 2 hours as needed.  - Call if any new or changing lesions are noted between office visits  History of Skin Cancer  Right jaw and left ear.    Clear. Observe for recurrence.  Call clinic for new or changing lesions.   Recommend regular skin exams, daily broad-spectrum spf 30+ sunscreen use, and photoprotection.     INTERTRIGO Exam Clear today inframammary  Chronic condition with duration or expected duration over one year. Currently well-controlled.   Intertrigo is a chronic recurrent rash that occurs in skin fold areas that may be associated with friction; heat; moisture; yeast; fungus; and bacteria.  It is exacerbated by increased movement / activity; sweating; and higher atmospheric temperature.  Treatment Plan Continue ketoconazole 2% cream every day/bid as needed for flares. Pt has at home.   Inflamed seborrheic keratosis Right Temple  Symptomatic, irritating, patient would like treated.  Destruction of lesion - Right Temple  Destruction method: cryotherapy   Informed consent: discussed and consent obtained   Lesion destroyed using liquid nitrogen: Yes   Region frozen until ice ball extended beyond lesion: Yes   Outcome: patient tolerated procedure well with no complications   Post-procedure details: wound care instructions given   Additional details:  Prior to procedure, discussed risks of blister formation, small wound, skin dyspigmentation, or rare scar following cryotherapy. Recommend Vaseline  ointment to treated areas while healing.   AK (actinic keratosis) (2) nasal tip x 2  Actinic keratoses are precancerous spots that appear secondary to cumulative UV radiation exposure/sun exposure over time. They are chronic with expected duration over 1 year. A portion of actinic keratoses will progress to squamous cell carcinoma of the skin. It is not possible to reliably predict which spots will progress to skin cancer and so treatment is recommended to prevent development of skin cancer.  Recommend daily broad spectrum sunscreen SPF 30+ to sun-exposed areas, reapply every 2 hours as needed.  Recommend staying in the shade or wearing long sleeves, sun glasses (UVA+UVB protection) and wide brim hats (4-inch brim around the entire circumference of the hat). Call for new or changing lesions.  Destruction of lesion - nasal tip x 2 (2)  Destruction method: cryotherapy   Informed consent: discussed and consent obtained   Lesion destroyed using liquid nitrogen: Yes   Region frozen until ice ball extended beyond lesion: Yes   Outcome: patient tolerated procedure well with no complications   Post-procedure details: wound care instructions given   Additional details:  Prior to procedure, discussed risks of blister formation, small wound, skin dyspigmentation, or rare scar following cryotherapy. Recommend Vaseline ointment to treated areas while healing.   Related Medications fluorouracil (EFUDEX) 5 % cream Apply twice daily to face for 1 week   Return in about 1 year (around 02/08/2024) for TBSE.  ICherlyn Labella, CMA, am acting as scribe for Willeen Niece, MD .   Documentation: I have reviewed the above documentation for accuracy and completeness, and I agree with the above.  Willeen Niece,  MD

## 2023-02-11 ENCOUNTER — Encounter: Payer: Medicare HMO | Admitting: Dermatology

## 2023-05-21 ENCOUNTER — Other Ambulatory Visit: Payer: Self-pay | Admitting: Nurse Practitioner

## 2023-05-21 DIAGNOSIS — Z1231 Encounter for screening mammogram for malignant neoplasm of breast: Secondary | ICD-10-CM

## 2023-06-14 DIAGNOSIS — K219 Gastro-esophageal reflux disease without esophagitis: Secondary | ICD-10-CM | POA: Diagnosis not present

## 2023-06-14 DIAGNOSIS — I10 Essential (primary) hypertension: Secondary | ICD-10-CM | POA: Diagnosis not present

## 2023-06-14 DIAGNOSIS — Z860100 Personal history of colon polyps, unspecified: Secondary | ICD-10-CM | POA: Diagnosis not present

## 2023-06-14 DIAGNOSIS — M810 Age-related osteoporosis without current pathological fracture: Secondary | ICD-10-CM | POA: Diagnosis not present

## 2023-06-14 DIAGNOSIS — J449 Chronic obstructive pulmonary disease, unspecified: Secondary | ICD-10-CM | POA: Diagnosis not present

## 2023-06-14 DIAGNOSIS — E039 Hypothyroidism, unspecified: Secondary | ICD-10-CM | POA: Diagnosis not present

## 2023-06-14 DIAGNOSIS — E785 Hyperlipidemia, unspecified: Secondary | ICD-10-CM | POA: Diagnosis not present

## 2023-06-14 DIAGNOSIS — Z6839 Body mass index (BMI) 39.0-39.9, adult: Secondary | ICD-10-CM | POA: Diagnosis not present

## 2023-06-14 DIAGNOSIS — Z008 Encounter for other general examination: Secondary | ICD-10-CM | POA: Diagnosis not present

## 2023-06-14 DIAGNOSIS — F17211 Nicotine dependence, cigarettes, in remission: Secondary | ICD-10-CM | POA: Diagnosis not present

## 2023-06-28 ENCOUNTER — Ambulatory Visit
Admission: RE | Admit: 2023-06-28 | Discharge: 2023-06-28 | Disposition: A | Discharge status: Home / Self Care | Payer: No Typology Code available for payment source | Source: Ambulatory Visit | Attending: Nurse Practitioner | Admitting: Nurse Practitioner

## 2023-06-28 DIAGNOSIS — Z1231 Encounter for screening mammogram for malignant neoplasm of breast: Secondary | ICD-10-CM | POA: Insufficient documentation

## 2023-08-10 DIAGNOSIS — G4733 Obstructive sleep apnea (adult) (pediatric): Secondary | ICD-10-CM | POA: Diagnosis not present

## 2023-08-13 DIAGNOSIS — H5213 Myopia, bilateral: Secondary | ICD-10-CM | POA: Diagnosis not present

## 2023-08-13 DIAGNOSIS — H43811 Vitreous degeneration, right eye: Secondary | ICD-10-CM | POA: Diagnosis not present

## 2023-08-13 DIAGNOSIS — H2513 Age-related nuclear cataract, bilateral: Secondary | ICD-10-CM | POA: Diagnosis not present

## 2023-08-17 DIAGNOSIS — K219 Gastro-esophageal reflux disease without esophagitis: Secondary | ICD-10-CM | POA: Diagnosis not present

## 2023-08-17 DIAGNOSIS — E782 Mixed hyperlipidemia: Secondary | ICD-10-CM | POA: Diagnosis not present

## 2023-08-17 DIAGNOSIS — I471 Supraventricular tachycardia, unspecified: Secondary | ICD-10-CM | POA: Diagnosis not present

## 2023-08-17 DIAGNOSIS — I10 Essential (primary) hypertension: Secondary | ICD-10-CM | POA: Diagnosis not present

## 2023-08-17 DIAGNOSIS — Z0000 Encounter for general adult medical examination without abnormal findings: Secondary | ICD-10-CM | POA: Diagnosis not present

## 2023-08-17 DIAGNOSIS — M810 Age-related osteoporosis without current pathological fracture: Secondary | ICD-10-CM | POA: Diagnosis not present

## 2023-08-17 DIAGNOSIS — J4489 Other specified chronic obstructive pulmonary disease: Secondary | ICD-10-CM | POA: Diagnosis not present

## 2023-08-17 DIAGNOSIS — Z8639 Personal history of other endocrine, nutritional and metabolic disease: Secondary | ICD-10-CM | POA: Diagnosis not present

## 2023-08-17 DIAGNOSIS — E039 Hypothyroidism, unspecified: Secondary | ICD-10-CM | POA: Diagnosis not present

## 2023-08-17 DIAGNOSIS — Z79899 Other long term (current) drug therapy: Secondary | ICD-10-CM | POA: Diagnosis not present

## 2023-08-17 DIAGNOSIS — G4733 Obstructive sleep apnea (adult) (pediatric): Secondary | ICD-10-CM | POA: Diagnosis not present

## 2023-09-01 DIAGNOSIS — M25551 Pain in right hip: Secondary | ICD-10-CM | POA: Diagnosis not present

## 2023-09-01 DIAGNOSIS — G8929 Other chronic pain: Secondary | ICD-10-CM | POA: Diagnosis not present

## 2023-09-01 DIAGNOSIS — Z6841 Body Mass Index (BMI) 40.0 and over, adult: Secondary | ICD-10-CM | POA: Diagnosis not present

## 2023-09-09 DIAGNOSIS — G4733 Obstructive sleep apnea (adult) (pediatric): Secondary | ICD-10-CM | POA: Diagnosis not present

## 2023-10-12 DIAGNOSIS — G4733 Obstructive sleep apnea (adult) (pediatric): Secondary | ICD-10-CM | POA: Diagnosis not present

## 2023-10-18 DIAGNOSIS — M810 Age-related osteoporosis without current pathological fracture: Secondary | ICD-10-CM | POA: Diagnosis not present

## 2023-10-18 DIAGNOSIS — M8588 Other specified disorders of bone density and structure, other site: Secondary | ICD-10-CM | POA: Diagnosis not present

## 2023-10-29 DIAGNOSIS — M810 Age-related osteoporosis without current pathological fracture: Secondary | ICD-10-CM | POA: Diagnosis not present

## 2023-10-29 DIAGNOSIS — E213 Hyperparathyroidism, unspecified: Secondary | ICD-10-CM | POA: Diagnosis not present

## 2023-11-02 ENCOUNTER — Other Ambulatory Visit: Payer: Self-pay | Admitting: Endocrinology

## 2023-11-02 DIAGNOSIS — E213 Hyperparathyroidism, unspecified: Secondary | ICD-10-CM

## 2023-11-11 DIAGNOSIS — G4733 Obstructive sleep apnea (adult) (pediatric): Secondary | ICD-10-CM | POA: Diagnosis not present

## 2023-11-22 ENCOUNTER — Ambulatory Visit
Admission: RE | Admit: 2023-11-22 | Discharge: 2023-11-22 | Disposition: A | Discharge status: Home / Self Care | Source: Ambulatory Visit | Attending: Endocrinology | Admitting: Endocrinology

## 2023-11-22 DIAGNOSIS — E041 Nontoxic single thyroid nodule: Secondary | ICD-10-CM | POA: Diagnosis not present

## 2023-11-22 DIAGNOSIS — E213 Hyperparathyroidism, unspecified: Secondary | ICD-10-CM | POA: Insufficient documentation

## 2023-11-30 DIAGNOSIS — E213 Hyperparathyroidism, unspecified: Secondary | ICD-10-CM | POA: Diagnosis not present

## 2023-12-10 DIAGNOSIS — Z8739 Personal history of other diseases of the musculoskeletal system and connective tissue: Secondary | ICD-10-CM | POA: Diagnosis not present

## 2023-12-10 DIAGNOSIS — E213 Hyperparathyroidism, unspecified: Secondary | ICD-10-CM | POA: Diagnosis not present

## 2023-12-11 DIAGNOSIS — G4733 Obstructive sleep apnea (adult) (pediatric): Secondary | ICD-10-CM | POA: Diagnosis not present

## 2024-01-10 DIAGNOSIS — G4733 Obstructive sleep apnea (adult) (pediatric): Secondary | ICD-10-CM | POA: Diagnosis not present

## 2024-01-12 DIAGNOSIS — R5383 Other fatigue: Secondary | ICD-10-CM | POA: Diagnosis not present

## 2024-01-12 DIAGNOSIS — E210 Primary hyperparathyroidism: Secondary | ICD-10-CM | POA: Diagnosis not present

## 2024-01-12 DIAGNOSIS — E041 Nontoxic single thyroid nodule: Secondary | ICD-10-CM | POA: Diagnosis not present

## 2024-01-12 DIAGNOSIS — D442 Neoplasm of uncertain behavior of parathyroid gland: Secondary | ICD-10-CM | POA: Diagnosis not present

## 2024-01-19 ENCOUNTER — Other Ambulatory Visit: Payer: Self-pay | Admitting: Otolaryngology

## 2024-01-19 DIAGNOSIS — D442 Neoplasm of uncertain behavior of parathyroid gland: Secondary | ICD-10-CM

## 2024-01-27 ENCOUNTER — Other Ambulatory Visit

## 2024-02-04 DIAGNOSIS — M79672 Pain in left foot: Secondary | ICD-10-CM | POA: Diagnosis not present

## 2024-02-04 DIAGNOSIS — M722 Plantar fascial fibromatosis: Secondary | ICD-10-CM | POA: Diagnosis not present

## 2024-02-09 ENCOUNTER — Ambulatory Visit
Admission: RE | Admit: 2024-02-09 | Discharge: 2024-02-09 | Disposition: A | Discharge status: Home / Self Care | Source: Ambulatory Visit | Attending: Otolaryngology | Admitting: Otolaryngology

## 2024-02-09 DIAGNOSIS — E041 Nontoxic single thyroid nodule: Secondary | ICD-10-CM | POA: Diagnosis not present

## 2024-02-09 DIAGNOSIS — C750 Malignant neoplasm of parathyroid gland: Secondary | ICD-10-CM | POA: Diagnosis not present

## 2024-02-09 DIAGNOSIS — D442 Neoplasm of uncertain behavior of parathyroid gland: Secondary | ICD-10-CM

## 2024-02-09 DIAGNOSIS — G4733 Obstructive sleep apnea (adult) (pediatric): Secondary | ICD-10-CM | POA: Diagnosis not present

## 2024-02-09 MED ORDER — IOPAMIDOL (ISOVUE-300) INJECTION 61%
75.0000 mL | Freq: Once | INTRAVENOUS | Status: AC | PRN
Start: 2024-02-09 — End: 2024-02-09
  Administered 2024-02-09: 75 mL via INTRAVENOUS

## 2024-02-22 ENCOUNTER — Encounter: Payer: Self-pay | Admitting: Dermatology

## 2024-02-22 ENCOUNTER — Ambulatory Visit: Payer: Medicare HMO | Admitting: Dermatology

## 2024-02-22 DIAGNOSIS — L719 Rosacea, unspecified: Secondary | ICD-10-CM | POA: Diagnosis not present

## 2024-02-22 DIAGNOSIS — D1801 Hemangioma of skin and subcutaneous tissue: Secondary | ICD-10-CM

## 2024-02-22 DIAGNOSIS — D2239 Melanocytic nevi of other parts of face: Secondary | ICD-10-CM

## 2024-02-22 DIAGNOSIS — L820 Inflamed seborrheic keratosis: Secondary | ICD-10-CM

## 2024-02-22 DIAGNOSIS — W908XXA Exposure to other nonionizing radiation, initial encounter: Secondary | ICD-10-CM

## 2024-02-22 DIAGNOSIS — E041 Nontoxic single thyroid nodule: Secondary | ICD-10-CM | POA: Diagnosis not present

## 2024-02-22 DIAGNOSIS — L814 Other melanin hyperpigmentation: Secondary | ICD-10-CM | POA: Diagnosis not present

## 2024-02-22 DIAGNOSIS — G4733 Obstructive sleep apnea (adult) (pediatric): Secondary | ICD-10-CM | POA: Diagnosis not present

## 2024-02-22 DIAGNOSIS — Z1283 Encounter for screening for malignant neoplasm of skin: Secondary | ICD-10-CM

## 2024-02-22 DIAGNOSIS — L578 Other skin changes due to chronic exposure to nonionizing radiation: Secondary | ICD-10-CM

## 2024-02-22 DIAGNOSIS — L304 Erythema intertrigo: Secondary | ICD-10-CM | POA: Diagnosis not present

## 2024-02-22 DIAGNOSIS — L219 Seborrheic dermatitis, unspecified: Secondary | ICD-10-CM

## 2024-02-22 DIAGNOSIS — L918 Other hypertrophic disorders of the skin: Secondary | ICD-10-CM | POA: Diagnosis not present

## 2024-02-22 DIAGNOSIS — L821 Other seborrheic keratosis: Secondary | ICD-10-CM

## 2024-02-22 DIAGNOSIS — D225 Melanocytic nevi of trunk: Secondary | ICD-10-CM | POA: Diagnosis not present

## 2024-02-22 DIAGNOSIS — Z86007 Personal history of in-situ neoplasm of skin: Secondary | ICD-10-CM

## 2024-02-22 DIAGNOSIS — D442 Neoplasm of uncertain behavior of parathyroid gland: Secondary | ICD-10-CM | POA: Diagnosis not present

## 2024-02-22 DIAGNOSIS — D229 Melanocytic nevi, unspecified: Secondary | ICD-10-CM

## 2024-02-22 MED ORDER — METRONIDAZOLE 0.75 % EX CREA: TOPICAL_CREAM | CUTANEOUS | 5 refills | Status: DC

## 2024-02-22 MED ORDER — KETOCONAZOLE 2 % EX CREA: TOPICAL_CREAM | CUTANEOUS | 5 refills | Status: DC

## 2024-02-22 NOTE — Patient Instructions (Addendum)
 Cryotherapy Aftercare  Wash gently with soap and water everyday.   Apply Vaseline Jelly daily until healed.     Recommend daily broad spectrum sunscreen SPF 30+ to sun-exposed areas, reapply every 2 hours as needed. Call for new or changing lesions.  Staying in the shade or wearing long sleeves, sun glasses (UVA+UVB protection) and wide brim hats (4-inch brim around the entire circumference of the hat) are also recommended for sun protection.   Apply Ketoconazole  cream once or twice daily to eyebrows as needed.     Melanoma ABCDEs  Melanoma is the most dangerous type of skin cancer, and is the leading cause of death from skin disease.  You are more likely to develop melanoma if you: Have light-colored skin, light-colored eyes, or red or blond hair Spend a lot of time in the sun Tan regularly, either outdoors or in a tanning bed Have had blistering sunburns, especially during childhood Have a close family member who has had a melanoma Have atypical moles or large birthmarks  Early detection of melanoma is key since treatment is typically straightforward and cure rates are extremely high if we catch it early.   The first sign of melanoma is often a change in a mole or a new dark spot.  The ABCDE system is a way of remembering the signs of melanoma.  A for asymmetry:  The two halves do not match. B for border:  The edges of the growth are irregular. C for color:  A mixture of colors are present instead of an even brown color. D for diameter:  Melanomas are usually (but not always) greater than 6mm - the size of a pencil eraser. E for evolution:  The spot keeps changing in size, shape, and color.  Please check your skin once per month between visits. You can use a small mirror in front and a large mirror behind you to keep an eye on the back side or your body.   If you see any new or changing lesions before your next follow-up, please call to schedule a visit.  Please continue daily  skin protection including broad spectrum sunscreen SPF 30+ to sun-exposed areas, reapplying every 2 hours as needed when you're outdoors.   Staying in the shade or wearing long sleeves, sun glasses (UVA+UVB protection) and wide brim hats (4-inch brim around the entire circumference of the hat) are also recommended for sun protection.      Due to recent changes in healthcare laws, you may see results of your pathology and/or laboratory studies on MyChart before the doctors have had a chance to review them. We understand that in some cases there may be results that are confusing or concerning to you. Please understand that not all results are received at the same time and often the doctors may need to interpret multiple results in order to provide you with the best plan of care or course of treatment. Therefore, we ask that you please give us  2 business days to thoroughly review all your results before contacting the office for clarification. Should we see a critical lab result, you will be contacted sooner.   If You Need Anything After Your Visit  If you have any questions or concerns for your doctor, please call our main line at [phone] and press option 4 to reach your doctor's medical assistant. If no one answers, please leave a voicemail as directed and we will return your call as soon as possible. Messages left after 4 pm will  be answered the following business day.   You may also send us  a message via MyChart. We typically respond to MyChart messages within 1-2 business days.  For prescription refills, please ask your pharmacy to contact our office. Our fax number is [phone].  If you have an urgent issue when the clinic is closed that cannot wait until the next business day, you can page your doctor at the number below.    Please note that while we do our best to be available for urgent issues outside of office hours, we are not available 24/7.   If you have an urgent issue and are  unable to reach us , you may choose to seek medical care at your doctor's office, retail clinic, urgent care center, or emergency room.  If you have a medical emergency, please immediately call 911 or go to the emergency department.  Pager Numbers  - Dr. Hester: [phone]  - Dr. Jackquline: [phone]  - Dr. Claudene: [phone]   - Dr. Raymund: [phone]  In the event of inclement weather, please call our main line at [phone] for an update on the status of any delays or closures.  Dermatology Medication Tips: Please keep the boxes that topical medications come in in order to help keep track of the instructions about where and how to use these. Pharmacies typically print the medication instructions only on the boxes and not directly on the medication tubes.   If your medication is too expensive, please contact our office at [phone] option 4 or send us  a message through MyChart.   We are unable to tell what your co-pay for medications will be in advance as this is different depending on your insurance coverage. However, we may be able to find a substitute medication at lower cost or fill out paperwork to get insurance to cover a needed medication.   If a prior authorization is required to get your medication covered by your insurance company, please allow us  1-2 business days to complete this process.  Drug prices often vary depending on where the prescription is filled and some pharmacies may offer cheaper prices.  The website www.goodrx.com contains coupons for medications through different pharmacies. The prices here do not account for what the cost may be with help from insurance (it may be cheaper with your insurance), but the website can give you the price if you did not use any insurance.  - You can print the associated coupon and take it with your prescription to the pharmacy.  - You may also stop by our office during regular business hours and pick up a GoodRx coupon  card.  - If you need your prescription sent electronically to a different pharmacy, notify our office through [facility] or by phone at [phone] option 4.     Si Usted Necesita Algo Despus de Su Visita  Tambin puede enviarnos un mensaje a travs de Clinical cytogeneticist. Por lo general respondemos a los mensajes de MyChart en el transcurso de 1 a 2 das hbiles.  Para renovar recetas, por favor pida a su farmacia que se ponga en contacto con nuestra oficina. Randi lakes de fax es [place] [phone].  Si tiene un asunto urgente cuando la clnica est cerrada y que no puede esperar hasta el siguiente da hbil, puede llamar/localizar a su doctor(a) al nmero que aparece a continuacin.   Por favor, tenga en cuenta que aunque hacemos todo lo posible para estar disponibles para asuntos urgentes fuera del horario de [place], no  estamos disponibles las 24 horas del da, los 7 [address]  [address] de la [place].   Si tiene un problema urgente y no puede comunicarse con nosotros, puede optar por buscar atencin mdica  en el consultorio de su doctor(a), en una clnica privada, en un centro de atencin urgente o en una sala de emergencias.  Si tiene Engineer, drilling, por favor llame inmediatamente al 911 o vaya a la sala de emergencias.  Nmeros de bper  - Dr. Hester: [phone]  - Dra. Jackquline: [phone]  - Dr. Claudene: [phone]  - Dra. Kitts: [phone]  En caso de inclemencias del [place], por favor llame a nuestra lnea principal al [phone] para una actualizacin sobre el estado de cualquier retraso o cierre.  Consejos para la medicacin en dermatologa: Por favor, guarde las cajas en las que vienen los medicamentos de uso tpico para ayudarle a seguir las instrucciones sobre dnde y cmo usarlos. Las farmacias generalmente imprimen las instrucciones del medicamento slo en las cajas y no directamente en los tubos del [place].   Si su medicamento es muy caro, por favor, pngase  en contacto con landry rieger llamando al [phone] y presione la opcin 4 o envenos un mensaje a travs de Clinical cytogeneticist.   No podemos decirle cul ser su copago por los medicamentos por adelantado ya que esto es diferente dependiendo de la cobertura de su seguro. Sin embargo, es posible que podamos encontrar un medicamento sustituto a Audiological scientist un formulario para que el seguro cubra el medicamento que se considera necesario.   Si se requiere una autorizacin previa para que su compaa de seguros malta su medicamento, por favor permtanos de 1 a 2 das hbiles para completar este proceso.  Los precios de los medicamentos varan con frecuencia dependiendo del Environmental consultant de dnde se surte la receta y alguna farmacias pueden ofrecer precios ms baratos.  El sitio web www.goodrx.com tiene cupones para medicamentos de Health and safety inspector. Los precios aqu no tienen en cuenta lo que podra costar con la ayuda del seguro (puede ser ms barato con su seguro), pero el sitio web puede darle el precio si no utiliz Tourist information centre manager.  - Puede imprimir el cupn correspondiente y llevarlo con su receta a la farmacia.  - Tambin puede pasar por nuestra oficina durante el horario de atencin regular y Education officer, museum una tarjeta de cupones de GoodRx.  - Si necesita que su receta se enve electrnicamente a una farmacia diferente, informe a nuestra oficina a travs de MyChart de [place] o por telfono llamando al [phone] y presione la opcin 4.

## 2024-02-22 NOTE — Progress Notes (Signed)
 Follow-Up Visit   Subjective  Gloria Simpson is a 68 y.o. female who presents for the following: Skin Cancer Screening and Full Body Skin Exam. Hx of SCCis.   Hx of AKs. Used 5FU/Calcipotriene on forehead as directed after last visit. No reaction.  Spot at left eyebrow still dry and scaly, stays irritated  The patient presents for Total-Body Skin Exam (TBSE) for skin cancer screening and mole check. The patient has spots, moles and lesions to be evaluated, some may be new or changing and the patient may have concern these could be cancer.    The following portions of the chart were reviewed this encounter and updated as appropriate: medications, allergies, medical history  Review of Systems:  No other skin or systemic complaints except as noted in HPI or Assessment and Plan.  Objective  Well appearing patient in no apparent distress; mood and affect are within normal limits.  A full examination was performed including scalp, head, eyes, ears, nose, lips, neck, chest, axillae, abdomen, back, buttocks, bilateral upper extremities, bilateral lower extremities, hands, feet, fingers, toes, fingernails, and toenails. All findings within normal limits unless otherwise noted below.   Relevant physical exam findings are noted in the Assessment and Plan.  L lateral eyebrow x1 Erythematous keratotic or waxy stuck-on papule  Assessment & Plan   SKIN CANCER SCREENING PERFORMED TODAY.  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 06/07/2020.  - No evidence of recurrence today - Recommend regular full body skin exams - Recommend daily broad spectrum sunscreen SPF 30+ to sun-exposed areas, reapply every 2 hours as needed.  - Call if any new or changing lesions are noted between office visits   History of Skin Cancer  Right jaw and left ear.    Clear. Observe for recurrence.  Call clinic for new or changing lesions.   Recommend regular skin exams,  daily broad-spectrum spf 30+ sunscreen use, and photoprotection.     ACTINIC DAMAGE - Chronic condition, secondary to cumulative UV/sun exposure - diffuse scaly erythematous macules with underlying dyspigmentation - Recommend daily broad spectrum sunscreen SPF 30+ to sun-exposed areas, reapply every 2 hours as needed.  - Staying in the shade or wearing long sleeves, sun glasses (UVA+UVB protection) and wide brim hats (4-inch brim around the entire circumference of the hat) are also recommended for sun protection.  - Call for new or changing lesions. - Pt used 5FU/VitD cream to face twice daily for 1 week since last visit, no reaction noted.  LENTIGINES, SEBORRHEIC KERATOSES, HEMANGIOMAS - Benign normal skin lesions - SK- waxy, tan patch at right dorsal foot - Benign-appearing - Call for any changes  MELANOCYTIC NEVI - Tan-brown and/or pink-flesh-colored symmetric macules and papules - flesh papule at R nasal root - 2 mm med brown macule at R upper back - Benign appearing on exam today - Observation - Call clinic for new or changing moles - Recommend daily use of broad spectrum spf 30+ sunscreen to sun-exposed areas.    ROSACEA Exam Mid face erythema with telangiectasias +/- scattered inflammatory papules   Chronic and persistent condition with duration or expected duration over one year. Condition is bothersome/symptomatic for patient. Currently flared.   Rosacea is a chronic progressive skin condition usually affecting the face of adults, causing redness and/or acne bumps. It is treatable but not curable. It sometimes affects the eyes (ocular rosacea) as well. It may respond to topical and/or systemic medication and can flare with  stress, sun exposure, alcohol, exercise, topical steroids (including hydrocortisone/cortisone 10) and some foods.  Daily application of broad spectrum spf 30+ sunscreen to face is recommended to reduce flares.  Patient denies grittiness of the  eyes  Treatment Plan Start Metronidazole 0.75% cream once or twice daily to affected areas on face as needed for rosacea   INTERTRIGO Exam: Clear today inframammary   Chronic condition with duration or expected duration over one year. Currently well-controlled.    Intertrigo is a chronic recurrent rash that occurs in skin fold areas that may be associated with friction; heat; moisture; yeast; fungus; and bacteria.  It is exacerbated by increased movement / activity; sweating; and higher atmospheric temperature.   Treatment Plan Continue ketoconazole  2% cream every day/bid as needed for flares. Pt has at home.   Acrochordon (Skin Tag) at left abdomen - Fleshy, skin-colored pedunculated papule - Benign appearing.  - Observe. - If desired, they can be removed with an in office procedure that is not covered by insurance. - Please call the clinic if you notice any new or changing lesions.   SEBORRHEIC DERMATITIS Exam: Pink patches with greasy scale at eyebrows  Chronic and persistent condition with duration or expected duration over one year. Condition is symptomatic/ bothersome to patient. Not currently at goal.   Seborrheic Dermatitis is a chronic persistent rash characterized by pinkness and scaling most commonly of the mid face but also can occur on the scalp (dandruff), ears; mid chest, mid back and groin.  It tends to be exacerbated by stress and cooler weather.  People who have neurologic disease may experience new onset or exacerbation of existing seborrheic dermatitis.  The condition is not curable but treatable and can be controlled.  Treatment Plan: Apply Ketoconazole  2% cream once or twice daily to eyebrows as needed.   Patient has at home.    INFLAMED SEBORRHEIC KERATOSIS L lateral eyebrow x1 Symptomatic, irritating, patient would like treated. Destruction of lesion - L lateral eyebrow x1  Destruction method: cryotherapy   Informed consent: discussed and consent  obtained   Lesion destroyed using liquid nitrogen: Yes   Region frozen until ice ball extended beyond lesion: Yes   Outcome: patient tolerated procedure well with no complications   Post-procedure details: wound care instructions given   Additional details:  Prior to procedure, discussed risks of blister formation, small wound, skin dyspigmentation, or rare scar following cryotherapy. Recommend Vaseline ointment to treated areas while healing.    Return in about 1 year (around 02/21/2025) for TBSE.  I, Kate Fought, CMA, am acting as scribe for Rexene Rattler, MD.   Documentation: I have reviewed the above documentation for accuracy and completeness, and I agree with the above.  Rexene Rattler, MD

## 2024-02-25 DIAGNOSIS — M722 Plantar fascial fibromatosis: Secondary | ICD-10-CM | POA: Diagnosis not present

## 2024-02-25 DIAGNOSIS — B353 Tinea pedis: Secondary | ICD-10-CM | POA: Diagnosis not present

## 2024-02-25 DIAGNOSIS — M79672 Pain in left foot: Secondary | ICD-10-CM | POA: Diagnosis not present

## 2024-03-17 ENCOUNTER — Other Ambulatory Visit: Payer: Self-pay | Admitting: Otolaryngology

## 2024-03-22 ENCOUNTER — Encounter
Admission: RE | Admit: 2024-03-22 | Discharge: 2024-03-22 | Disposition: A | Discharge status: Home / Self Care | Source: Ambulatory Visit | Attending: Otolaryngology | Admitting: Otolaryngology

## 2024-03-22 ENCOUNTER — Other Ambulatory Visit: Payer: Self-pay

## 2024-03-22 VITALS — Ht <= 58 in | Wt 197.0 lb

## 2024-03-22 DIAGNOSIS — Z01812 Encounter for preprocedural laboratory examination: Secondary | ICD-10-CM

## 2024-03-22 DIAGNOSIS — Z01810 Encounter for preprocedural cardiovascular examination: Secondary | ICD-10-CM

## 2024-03-22 DIAGNOSIS — I10 Essential (primary) hypertension: Secondary | ICD-10-CM

## 2024-03-22 HISTORY — DX: Sleep apnea, unspecified: G47.30

## 2024-03-22 NOTE — Patient Instructions (Addendum)
 Your procedure is scheduled on:  FRIDAY NOVEMBER 14 Report to the Registration Desk on the 1st floor of the [facility] To find out your arrival time, please call [phone] between 1PM - 3PM on:   THURSDAY  NOVEMBER 13 If your arrival time is 6:00 am, do not arrive before that time as the [facility] entrance doors do not open until 6:00 am.  REMEMBER: Instructions that are not followed completely may result in serious medical risk, up to and including death; or upon the discretion of your surgeon and anesthesiologist your surgery may need to be rescheduled.  Do not eat food after midnight the night before surgery.  No gum chewing or hard candies.  One week prior to surgery:  STARTING FRIDAY NOVEMBER 7  Stop Anti-inflammatories (NSAIDS) such as Advil , Aleve, Ibuprofen , Motrin , Naproxen, Naprosyn and Aspirin based products such as Excedrin, Goody's Powder, BC Powder. Stop ANY OVER THE COUNTER supplements until after surgery. Cholecalciferol ( VITAMIN D3)  fluticasone (FLONASE)   meloxicam (MOBIC) hold 7 days prior to surgery, last dose THURSDAY NOVEMBER 6   You may however, continue to take Tylenol if needed for pain up until the day of surgery.  Continue taking all of your other prescription medications up until the day of surgery.  ON THE DAY OF SURGERY DO NOT TAKE ANY MEDICATION    Use inhalers on the day of surgery and bring to the hospital. albuterol (PROVENTIL HFA;VENTOLIN HFA)   No chewable tobacco products for at least 6 hours before surgery.    Do not use any recreational drugs for at least a week (preferably 2 weeks) before your surgery.  Please be advised that the combination of cocaine and anesthesia may have negative outcomes, up to and including death. If you test positive for cocaine, your surgery will be cancelled.  On the morning of surgery brush your teeth with toothpaste and water, you may rinse your mouth with mouthwash if you wish. Do not swallow any  toothpaste or mouthwash.  Use CHG Soap as directed on instruction sheet.  Do not wear jewelry, make-up, hairpins, clips or nail polish.  For welded (permanent) jewelry: bracelets, anklets, waist bands, etc.  Please have this removed prior to surgery.  If it is not removed, there is a chance that hospital personnel will need to cut it off on the day of surgery.  Do not wear lotions, powders, or perfumes.   Do not shave body hair from the neck down 48 hours before surgery.  Contact lenses, hearing aids and dentures may not be worn into surgery.  Do not bring valuables to the hospital. [facility] is not responsible for any missing/lost belongings or valuables.   Notify your doctor if there is any change in your medical condition (cold, fever, infection).  Wear comfortable clothing (specific to your surgery type) to the hospital.  After surgery, you can help prevent lung complications by doing breathing exercises.  Take deep breaths and cough every 1-2 hours.   If you are being admitted to the hospital overnight, leave your suitcase in the car. After surgery it may be brought to your room.  In case of increased patient census, it may be necessary for you, the patient, to continue your postoperative care in the Same Day Surgery department.  If you are being discharged the day of surgery, you will not be allowed to drive home. You will need a responsible individual to drive you home and stay with you for 24 hours  after surgery.   If you are taking public transportation, you will need to have a responsible individual with you.  Please call the Pre-admissions Testing Dept. at [phone] if you have any questions about these instructions.  Surgery Visitation Policy:  Patients having surgery or a procedure may have two visitors.  Children under the age of 29 must have an adult with them who is not the patient.  Inpatient Visitation:    Visiting hours are 7 a.m. to 8 p.m. Up to  four visitors are allowed at one time in a patient room. The visitors may rotate out with other people during the day.  One visitor age 70 or older may stay with the patient overnight and must be in the room by 8 p.m.   Merchandiser, Retail to address health-related social needs:  [email or web address]                                                                                                               Preparing for Surgery with CHLORHEXIDINE GLUCONATE (CHG) Soap  Chlorhexidine Gluconate (CHG) Soap  o An antiseptic cleaner that kills germs and bonds with the skin to continue killing germs even after washing  o Used for showering the night before surgery and morning of surgery  Before surgery, you can play an important role by reducing the number of germs on your skin.  CHG (Chlorhexidine gluconate) soap is an antiseptic cleanser which kills germs and bonds with the skin to continue killing germs even after washing.  Please do not use if you have an allergy to CHG or antibacterial soaps. If your skin becomes reddened/irritated stop using the CHG.  1. Shower the NIGHT BEFORE SURGERY with CHG soap.  2. If you choose to wash your hair, wash your hair first as usual with your normal shampoo.  3. After shampooing, rinse your hair and body thoroughly to remove the shampoo.  4. Use CHG as you would any other liquid soap. You can apply CHG directly to the skin and wash gently with a clean washcloth.  5. Apply the CHG soap to your body only from the neck down. Do not use on open wounds or open sores. Avoid contact with your eyes, ears, mouth, and genitals (private parts). Wash face and genitals (private parts) with your normal soap.  6. Wash thoroughly, paying special attention to the area where your surgery will be performed.  7. Thoroughly rinse your body with warm water.  8. Do not shower/wash with your normal soap after using and rinsing off the CHG  soap.  9. Do not use lotions, oils, etc., after showering with CHG.  10. Pat yourself dry with a clean towel.  11. Wear clean pajamas to bed the night before surgery.  12. Place clean sheets on your bed the night of your shower and do not sleep with pets.  13. Do not apply any deodorants/lotions/powders.  14. Please wear clean clothes to the hospital.  15. Remember to brush your teeth with your regular toothpaste.

## 2024-03-23 ENCOUNTER — Encounter
Admission: RE | Admit: 2024-03-23 | Discharge: 2024-03-23 | Disposition: A | Discharge status: Home / Self Care | Source: Ambulatory Visit | Attending: Otolaryngology | Admitting: Otolaryngology

## 2024-03-23 DIAGNOSIS — Z01810 Encounter for preprocedural cardiovascular examination: Secondary | ICD-10-CM

## 2024-03-23 DIAGNOSIS — I10 Essential (primary) hypertension: Secondary | ICD-10-CM | POA: Diagnosis not present

## 2024-03-23 DIAGNOSIS — Z01812 Encounter for preprocedural laboratory examination: Secondary | ICD-10-CM

## 2024-03-23 DIAGNOSIS — Z01818 Encounter for other preprocedural examination: Secondary | ICD-10-CM | POA: Diagnosis present

## 2024-03-23 LAB — BASIC METABOLIC PANEL WITH GFR
Anion gap: 7 (ref 5–15)
BUN: 14 mg/dL (ref 8–23)
CO2: 28 mmol/L (ref 22–32)
Calcium: 11 mg/dL — ABNORMAL HIGH (ref 8.9–10.3)
Chloride: 105 mmol/L (ref 98–111)
Creatinine, Ser: 0.78 mg/dL (ref 0.44–1.00)
GFR, Estimated: >60 mL/min (ref >60)
Glucose, Bld: 98 mg/dL (ref 70–99)
Potassium: 3.8 mmol/L (ref 3.5–5.1)
Sodium: 140 mmol/L (ref 135–145)

## 2024-03-23 LAB — CBC
HCT: 44.2 % (ref 36.0–46.0)
Hemoglobin: 14.6 g/dL (ref 12.0–15.0)
MCH: 31.1 pg (ref 26.0–34.0)
MCHC: 33 g/dL (ref 30.0–36.0)
MCV: 94.2 fL (ref 80.0–100.0)
Platelets: 220 K/uL (ref 150–400)
RBC: 4.69 MIL/uL (ref 3.87–5.11)
RDW: 14.1 % (ref 11.5–15.5)
WBC: 6.6 K/uL (ref 4.0–10.5)
nRBC: 0 % (ref 0.0–0.2)

## 2024-03-31 ENCOUNTER — Ambulatory Visit: Admitting: Certified Registered"

## 2024-03-31 ENCOUNTER — Encounter: Payer: Self-pay | Admitting: Otolaryngology

## 2024-03-31 ENCOUNTER — Ambulatory Visit
Admission: RE | Admit: 2024-03-31 | Discharge: 2024-03-31 | Disposition: A | Discharge status: Home / Self Care | Attending: Otolaryngology | Admitting: Otolaryngology

## 2024-03-31 ENCOUNTER — Encounter
Admission: RE | Disposition: A | Discharge status: Home / Self Care | Payer: Self-pay | Source: Home / Self Care | Attending: Otolaryngology

## 2024-03-31 ENCOUNTER — Ambulatory Visit: Payer: Self-pay | Admitting: Urgent Care

## 2024-03-31 ENCOUNTER — Other Ambulatory Visit: Payer: Self-pay

## 2024-03-31 DIAGNOSIS — Z79899 Other long term (current) drug therapy: Secondary | ICD-10-CM | POA: Diagnosis not present

## 2024-03-31 DIAGNOSIS — D351 Benign neoplasm of parathyroid gland: Secondary | ICD-10-CM | POA: Diagnosis present

## 2024-03-31 DIAGNOSIS — Z87891 Personal history of nicotine dependence: Secondary | ICD-10-CM | POA: Diagnosis not present

## 2024-03-31 DIAGNOSIS — I10 Essential (primary) hypertension: Secondary | ICD-10-CM | POA: Diagnosis not present

## 2024-03-31 DIAGNOSIS — Z7951 Long term (current) use of inhaled steroids: Secondary | ICD-10-CM | POA: Diagnosis not present

## 2024-03-31 DIAGNOSIS — G4730 Sleep apnea, unspecified: Secondary | ICD-10-CM | POA: Diagnosis not present

## 2024-03-31 DIAGNOSIS — K219 Gastro-esophageal reflux disease without esophagitis: Secondary | ICD-10-CM | POA: Insufficient documentation

## 2024-03-31 HISTORY — PX: PARATHYROIDECTOMY: SHX19

## 2024-03-31 HISTORY — PX: CONTINUOUS NERVE MONITORING: SHX6650

## 2024-03-31 SURGERY — PARATHYROIDECTOMY
Anesthesia: General | Laterality: Left

## 2024-03-31 MED ORDER — BACITRACIN ZINC 500 UNIT/GM EX OINT
TOPICAL_OINTMENT | CUTANEOUS | Status: AC
  Filled 2024-03-31: qty 28.35

## 2024-03-31 MED ORDER — CHLORHEXIDINE GLUCONATE 0.12 % MT SOLN
15.0000 mL | Freq: Once | OROMUCOSAL | Status: AC
  Administered 2024-03-31: 15 mL via OROMUCOSAL

## 2024-03-31 MED ORDER — CHLORHEXIDINE GLUCONATE 0.12 % MT SOLN
OROMUCOSAL | Status: AC
  Filled 2024-03-31: qty 15

## 2024-03-31 MED ORDER — DEXAMETHASONE SOD PHOSPHATE PF 10 MG/ML IJ SOLN
INTRAMUSCULAR | Status: DC | PRN
  Administered 2024-03-31: 10 mg via INTRAVENOUS

## 2024-03-31 MED ORDER — DROPERIDOL 2.5 MG/ML IJ SOLN: 0.6250 mg | Freq: Once | INTRAMUSCULAR | Status: DC | PRN

## 2024-03-31 MED ORDER — FENTANYL CITRATE (PF) 100 MCG/2ML IJ SOLN
INTRAMUSCULAR | Status: AC
  Filled 2024-03-31: qty 2

## 2024-03-31 MED ORDER — MIDAZOLAM HCL 2 MG/2ML IJ SOLN
INTRAMUSCULAR | Status: AC
  Filled 2024-03-31: qty 2

## 2024-03-31 MED ORDER — PHENYLEPHRINE 80 MCG/ML (10ML) SYRINGE FOR IV PUSH (FOR BLOOD PRESSURE SUPPORT)
PREFILLED_SYRINGE | INTRAVENOUS | Status: DC | PRN
  Administered 2024-03-31: 160 ug via INTRAVENOUS

## 2024-03-31 MED ORDER — PROPOFOL 1000 MG/100ML IV EMUL
INTRAVENOUS | Status: AC
  Filled 2024-03-31: qty 100

## 2024-03-31 MED ORDER — LIDOCAINE-EPINEPHRINE (PF) 1 %-1:200000 IJ SOLN
INTRAMUSCULAR | Status: AC
  Filled 2024-03-31: qty 30

## 2024-03-31 MED ORDER — DEXMEDETOMIDINE HCL IN NACL 80 MCG/20ML IV SOLN
INTRAVENOUS | Status: DC | PRN
  Administered 2024-03-31: 8 ug via INTRAVENOUS
  Administered 2024-03-31: 4 ug via INTRAVENOUS

## 2024-03-31 MED ORDER — SODIUM CHLORIDE 0.9 % IV SOLN: INTRAVENOUS | Status: DC

## 2024-03-31 MED ORDER — HEMOSTATIC AGENTS (NO CHARGE) OPTIME
TOPICAL | Status: DC | PRN
  Administered 2024-03-31: 1 via TOPICAL

## 2024-03-31 MED ORDER — FENTANYL CITRATE (PF) 100 MCG/2ML IJ SOLN
25.0000 ug | INTRAMUSCULAR | Status: DC | PRN
  Administered 2024-03-31: 25 ug via INTRAVENOUS

## 2024-03-31 MED ORDER — ACETAMINOPHEN 10 MG/ML IV SOLN
INTRAVENOUS | Status: AC
  Filled 2024-03-31: qty 100

## 2024-03-31 MED ORDER — LIDOCAINE-EPINEPHRINE (PF) 1 %-1:200000 IJ SOLN
INTRAMUSCULAR | Status: DC | PRN
Start: 2024-03-31 — End: 2024-03-31
  Administered 2024-03-31: 5 mL

## 2024-03-31 MED ORDER — LACTATED RINGERS IV SOLN
INTRAVENOUS | Status: DC | PRN
Start: 2024-03-31 — End: 2024-03-31

## 2024-03-31 MED ORDER — ORAL CARE MOUTH RINSE: 15.0000 mL | Freq: Once | OROMUCOSAL | Status: AC

## 2024-03-31 MED ORDER — LIDOCAINE HCL (CARDIAC) PF 100 MG/5ML IV SOSY
PREFILLED_SYRINGE | INTRAVENOUS | Status: DC | PRN
  Administered 2024-03-31 (×2): 100 mg via INTRAVENOUS

## 2024-03-31 MED ORDER — OXYCODONE HCL 5 MG/5ML PO SOLN: 5.0000 mg | Freq: Once | ORAL | Status: DC | PRN

## 2024-03-31 MED ORDER — ONDANSETRON HCL 4 MG/2ML IJ SOLN
INTRAMUSCULAR | Status: DC | PRN
  Administered 2024-03-31: 4 mg via INTRAVENOUS

## 2024-03-31 MED ORDER — MIDAZOLAM HCL (PF) 2 MG/2ML IJ SOLN
INTRAMUSCULAR | Status: DC | PRN
  Administered 2024-03-31: 2 mg via INTRAVENOUS

## 2024-03-31 MED ORDER — ACETAMINOPHEN 10 MG/ML IV SOLN: 1000.0000 mg | Freq: Once | INTRAVENOUS | Status: DC | PRN

## 2024-03-31 MED ORDER — PROPOFOL 10 MG/ML IV BOLUS
INTRAVENOUS | Status: DC | PRN
  Administered 2024-03-31: 100 mg via INTRAVENOUS
  Administered 2024-03-31: 100 ug/kg/min via INTRAVENOUS

## 2024-03-31 MED ORDER — OXYCODONE HCL 5 MG PO TABS: 5.0000 mg | ORAL_TABLET | Freq: Once | ORAL | Status: DC | PRN

## 2024-03-31 MED ORDER — SUCCINYLCHOLINE CHLORIDE 200 MG/10ML IV SOSY
PREFILLED_SYRINGE | INTRAVENOUS | Status: DC | PRN
  Administered 2024-03-31: 50 mg via INTRAVENOUS

## 2024-03-31 MED ORDER — FENTANYL CITRATE (PF) 100 MCG/2ML IJ SOLN
INTRAMUSCULAR | Status: DC | PRN
  Administered 2024-03-31 (×2): 50 ug via INTRAVENOUS
  Administered 2024-03-31: 100 ug via INTRAVENOUS

## 2024-03-31 MED ORDER — ACETAMINOPHEN 10 MG/ML IV SOLN
INTRAVENOUS | Status: DC | PRN
  Administered 2024-03-31: 1000 mg via INTRAVENOUS

## 2024-03-31 SURGICAL SUPPLY — 30 items
BLADE SURG 15 STRL LF DISP TIS (BLADE) ×1 IMPLANT
CORD BIP STRL DISP 12FT (MISCELLANEOUS) ×1 IMPLANT
DRAIN WOUND RND W/TROCAR (DRAIN) ×1 IMPLANT
DRSG TEGADERM 2-3/8X2-3/4 SM (GAUZE/BANDAGES/DRESSINGS) ×1 IMPLANT
DRSG TEGADERM 6X8 (GAUZE/BANDAGES/DRESSINGS) ×1 IMPLANT
DRSG TELFA 3X8 NADH STRL (GAUZE/BANDAGES/DRESSINGS) ×1 IMPLANT
ELECTRODE LARYNGEAL 6/7 (MISCELLANEOUS) ×1 IMPLANT
ELECTRODE LARYNGEAL 8/9 (MISCELLANEOUS) ×1 IMPLANT
ELECTRODE REM PT RTRN 9FT ADLT (ELECTROSURGICAL) ×1 IMPLANT
EVACUATOR SILICONE 100CC (DRAIN) IMPLANT
FORCEPS JEWEL BIP 4-3/4 STR (INSTRUMENTS) ×1 IMPLANT
GAUZE 4X4 16PLY ~~LOC~~+RFID DBL (SPONGE) ×2 IMPLANT
GLOVE BIO SURGEON STRL SZ7.5 (GLOVE) ×1 IMPLANT
GOWN STRL REUS W/ TWL LRG LVL3 (GOWN DISPOSABLE) ×3 IMPLANT
HEMOSTAT SURGICEL 2X14 (HEMOSTASIS) IMPLANT
HOOK STAY BLUNT/RETRACTOR 5M (MISCELLANEOUS) ×1 IMPLANT
KIT TURNOVER KIT A (KITS) ×1 IMPLANT
LABEL OR SOLS (LABEL) ×1 IMPLANT
MANIFOLD NEPTUNE II (INSTRUMENTS) ×1 IMPLANT
NS IRRIG 500ML POUR BTL (IV SOLUTION) ×1 IMPLANT
PACK HEAD/NECK (MISCELLANEOUS) ×1 IMPLANT
PROBE NEUROSIGN BIPOL (MISCELLANEOUS) ×1 IMPLANT
SHEARS HARMONIC 9CM CVD (BLADE) ×1 IMPLANT
SPONGE KITTNER 5P (MISCELLANEOUS) ×1 IMPLANT
SUT ETHILON 6 0 9-3 1X18 BLK (SUTURE) ×1 IMPLANT
SUT PROLENE 6 0 P 1 18 (SUTURE) ×1 IMPLANT
SUT SILK 2-0 18XBRD TIE 12 (SUTURE) ×1 IMPLANT
SUT VIC AB 4-0 RB1 18 (SUTURE) ×1 IMPLANT
TRAP FLUID SMOKE EVACUATOR (MISCELLANEOUS) ×1 IMPLANT
WATER STERILE IRR 500ML POUR (IV SOLUTION) ×1 IMPLANT

## 2024-03-31 NOTE — Anesthesia Procedure Notes (Signed)
 Procedure Name: Intubation Date/Time: 03/31/2024 7:51 AM  Performed by: Norleen Alberta HERO., CRNAPre-anesthesia Checklist: Patient identified, Patient being monitored, Timeout performed, Emergency Drugs available and Suction available Patient Re-evaluated:Patient Re-evaluated prior to induction Oxygen Delivery Method: Circle system utilized Preoxygenation: Pre-oxygenation with 100% oxygen Induction Type: IV induction Ventilation: Mask ventilation without difficulty Laryngoscope Size: 3 and McGrath Grade View: Grade I Tube type: Oral Tube size: 6.0 mm Number of attempts: 1 Airway Equipment and Method: Stylet Placement Confirmation: ETT inserted through vocal cords under direct vision, positive ETCO2 and breath sounds checked- equal and bilateral Secured at: 18 cm Tube secured with: Tape Dental Injury: Teeth and Oropharynx as per pre-operative assessment

## 2024-03-31 NOTE — Transfer of Care (Signed)
 Immediate Anesthesia Transfer of Care Note  Patient: Gloria Simpson  Procedure(s) Performed: PARATHYROIDECTOMY (Left) NERVE MONITORING, INTRAOPERATIVE (Left)  Patient Location: PACU  Anesthesia Type:General  Level of Consciousness: drowsy and patient cooperative  Airway & Oxygen Therapy: Patient Spontanous Breathing and Patient connected to face mask oxygen  Post-op Assessment: Report given to RN and Post -op Vital signs reviewed and stable  Post vital signs: stable  Last Vitals:  Vitals Value Taken Time  BP 137/117 03/31/24 08:56  Temp    Pulse 87 03/31/24 08:59  Resp 14 03/31/24 08:59  SpO2 96 % 03/31/24 08:59  Vitals shown include unfiled device data.  Last Pain:  Vitals:   03/31/24 0616  TempSrc: Oral  PainSc: 0-No pain         Complications: There were no known notable events for this encounter.

## 2024-03-31 NOTE — Anesthesia Preprocedure Evaluation (Signed)
 Anesthesia Evaluation  Patient identified by MRN, date of birth, ID band Patient awake    Reviewed: Allergy & Precautions, H&P , NPO status , Patient's Chart, lab work & pertinent test results, reviewed documented beta blocker date and time   Airway Mallampati: III  TM Distance: <3 FB Neck ROM: full    Dental  (+) Teeth Intact, Dental Advidsory Given   Pulmonary sleep apnea and Continuous Positive Airway Pressure Ventilation , former smoker   Pulmonary exam normal        Cardiovascular Exercise Tolerance: Poor hypertension, On Medications negative cardio ROS Normal cardiovascular exam Rhythm:regular Rate:Normal     Neuro/Psych negative neurological ROS  negative psych ROS   GI/Hepatic Neg liver ROS,GERD  Medicated,,  Endo/Other  negative endocrine ROS    Renal/GU negative Renal ROS  negative genitourinary   Musculoskeletal   Abdominal   Peds  Hematology negative hematology ROS (+)   Anesthesia Other Findings Past Medical History: 09/28/2008: Actinic keratosis     Comment:  R upper forehead - bx proven  No date: Cancer (HCC)     Comment:  skin ca No date: GERD (gastroesophageal reflux disease) No date: Hyperlipidemia No date: Hypertension No date: Skin cancer     Comment:  left ear, Mohs. Right jaw. No date: Sleep apnea 06/07/2020: Squamous cell carcinoma of skin     Comment:  R nasal sidewall - SCCIS MOHS  Past Surgical History: No date: CESAREAN SECTION No date: CHOLECYSTECTOMY BMI    Body Mass Index: 40.76 kg/m     Reproductive/Obstetrics negative OB ROS                              Anesthesia Physical Anesthesia Plan  ASA: 3  Anesthesia Plan: General ETT   Post-op Pain Management:    Induction:   PONV Risk Score and Plan: 4 or greater  Airway Management Planned:   Additional Equipment:   Intra-op Plan:   Post-operative Plan:   Informed Consent: I have  reviewed the patients History and Physical, chart, labs and discussed the procedure including the risks, benefits and alternatives for the proposed anesthesia with the patient or authorized representative who has indicated his/her understanding and acceptance.     Dental Advisory Given  Plan Discussed with: CRNA  Anesthesia Plan Comments:         Anesthesia Quick Evaluation

## 2024-03-31 NOTE — H&P (Signed)
H&P has been reviewed and patient reevaluated, no changes necessary. To be downloaded later.  

## 2024-03-31 NOTE — Discharge Instructions (Addendum)
 Take ibuprofen  and tylenol at home for pain  Last dose tylenol at 0800 am

## 2024-03-31 NOTE — Op Note (Signed)
 03/31/2024  8:57 AM    Glo Mems  [id number]   Pre-Op Dx: Left inferior parathyroid adenoma  Post-op Dx: Same  Proc: Excision left inferior parathyroid gland  Surg:  Deward VEAR Argue      assist: Carolee Vaught  Anes:  GOT  EBL: 20 mL  Comp: None  Findings: Parathyroid gland approximately 2 cm in length but only 6 cm in thickness and about 12 mm in depth.  Procedure: The patient was brought to the operating room and placed in a spine position.  She was given general anesthesia by oral intubation.  A special endotracheal tube was used that was wrapped with electrodes for monitoring of the recurrent laryngeal nerve continuously during surgery.  The intubation was done with a glide scope to make sure that the electrodes were placed in the correct position in the larynx.  The patient then had her neck marked and 5 mL 1% Xylocaine with epi 1: 100,000 were used for infiltration in the subcu tissues.  She was then prepped and draped in sterile fashion.  A 1 inch incision was created in the left lower neck skin crease and carried through the skin and subcutaneous tissue.  This was divided with a harmonic to get down to the strap muscles.  These were divided with the harmonic as well.  There was some deep fat layer that was dissected through to get down to the strap muscles.  The strap muscles were elevated to find the lateral border of the thyroid  gland.  Once this was found dissection was carried along its lateral inferior border to find the left parathyroid mass.  It was pink and overlying separately behind the thyroid  gland.  It was easy to separate from the thyroid  gland.  It was bluntly dissected all around.  There is small blood vessel inferiorly.  Using the nerve monitor there was no evidence of stimulation of the recurrent laryngeal nerve as we were probably more lateral than the nerve at the inferior location.  The specimen was then freed up and pulled out in its remaining  attachments were loosened.  The specimen was sent for permanent section.  This was all done by tunneling down through a very small hole to get directly to this area.  There is no significant bleeding in the wound.  A very small piece of Surgicel was placed into the wound.  The strap muscles were then allowed to fall back in the normal position and the wound closed up nicely.  There was no drains placed.  The platysma muscle was then sutured using a mattress suture of 4-0 Vicryl.  Subdermal stitches were then placed using interrupted 4-0 Vicryl's to hold the skin together.  The skin was then covered with Dermabond and 1 Steri-Strip.  The patient tolerated the procedure well.  There were no operative complications.  She was awakened and taken to the recovery room in satisfactory condition.  Dispo:   To PACU to be discharged home  Plan: To follow-up in the office in 1 week to evaluate the wound make sure she is doing well.  She will start calcium supplements at home today.  Deward VEAR Tanyon Alipio  03/31/2024 8:57 AM

## 2024-04-03 ENCOUNTER — Encounter: Payer: Self-pay | Admitting: Otolaryngology

## 2024-04-03 LAB — SURGICAL PATHOLOGY

## 2024-05-01 NOTE — Anesthesia Postprocedure Evaluation (Signed)
 Anesthesia Post Note  Patient: Gloria Simpson  Procedure(s) Performed: PARATHYROIDECTOMY (Left) NERVE MONITORING, INTRAOPERATIVE (Left)  Patient location during evaluation: PACU Anesthesia Type: General Level of consciousness: awake and alert Pain management: pain level controlled Vital Signs Assessment: post-procedure vital signs reviewed and stable Respiratory status: spontaneous breathing, nonlabored ventilation, respiratory function stable and patient connected to nasal cannula oxygen Cardiovascular status: blood pressure returned to baseline and stable Postop Assessment: no apparent nausea or vomiting Anesthetic complications: no   There were no known notable events for this encounter.   Last Vitals:  Vitals:   03/31/24 0930 03/31/24 0952  BP: (!) 143/81 131/78  Pulse: 68 65  Resp: 14 16  Temp:  (!) 36.1 C  SpO2: 94% 96%    Last Pain:  Vitals:   03/31/24 0952  TempSrc: Temporal  PainSc: 0-No pain                 Lynwood KANDICE Clause

## 2024-05-16 ENCOUNTER — Other Ambulatory Visit: Payer: Self-pay | Admitting: Nurse Practitioner

## 2024-05-16 DIAGNOSIS — Z1231 Encounter for screening mammogram for malignant neoplasm of breast: Secondary | ICD-10-CM

## 2024-07-25 ENCOUNTER — Ambulatory Visit

## 2025-02-27 ENCOUNTER — Encounter: Admitting: Dermatology
